# Patient Record
Sex: Male | Born: 1955 | Race: White | Hispanic: Yes | Marital: Married | Smoking: Never smoker
Health system: Southern US, Community
[De-identification: ages and names within clinical notes are randomized; demographics above are authoritative.]

---

## 2014-12-02 ENCOUNTER — Other Ambulatory Visit (HOSPITAL_COMMUNITY): Payer: Self-pay | Admitting: Family Medicine

## 2014-12-02 DIAGNOSIS — M6289 Other specified disorders of muscle: Secondary | ICD-10-CM

## 2014-12-02 DIAGNOSIS — R5383 Other fatigue: Secondary | ICD-10-CM

## 2014-12-02 DIAGNOSIS — R634 Abnormal weight loss: Secondary | ICD-10-CM

## 2014-12-31 ENCOUNTER — Encounter (HOSPITAL_COMMUNITY)
Admission: RE | Admit: 2014-12-31 | Discharge: 2014-12-31 | Disposition: A | Discharge status: Home / Self Care | Payer: Commercial Managed Care - PPO | Source: Ambulatory Visit | Attending: Family Medicine | Admitting: Family Medicine

## 2014-12-31 DIAGNOSIS — R5383 Other fatigue: Secondary | ICD-10-CM | POA: Insufficient documentation

## 2014-12-31 DIAGNOSIS — R634 Abnormal weight loss: Secondary | ICD-10-CM

## 2014-12-31 DIAGNOSIS — M6289 Other specified disorders of muscle: Secondary | ICD-10-CM | POA: Insufficient documentation

## 2014-12-31 MED ORDER — SODIUM IODIDE I 131 CAPSULE
11.0100 | Freq: Once | INTRAVENOUS | Status: AC | PRN
  Administered 2014-12-31: 11.01 via ORAL

## 2015-01-01 ENCOUNTER — Encounter (HOSPITAL_COMMUNITY)
Admission: RE | Admit: 2015-01-01 | Discharge: 2015-01-01 | Disposition: A | Discharge status: Home / Self Care | Payer: Commercial Managed Care - PPO | Source: Ambulatory Visit | Attending: Family Medicine | Admitting: Family Medicine

## 2015-01-01 DIAGNOSIS — R634 Abnormal weight loss: Secondary | ICD-10-CM | POA: Diagnosis present

## 2015-01-01 DIAGNOSIS — M6289 Other specified disorders of muscle: Secondary | ICD-10-CM | POA: Diagnosis present

## 2015-01-01 DIAGNOSIS — R5383 Other fatigue: Secondary | ICD-10-CM | POA: Diagnosis present

## 2015-01-05 MED ORDER — SODIUM PERTECHNETATE TC 99M INJECTION
10.0000 | Freq: Once | INTRAVENOUS | Status: AC | PRN
  Administered 2015-01-01: 10 via INTRAVENOUS

## 2016-06-16 ENCOUNTER — Encounter: Payer: Self-pay | Admitting: "Endocrinology

## 2016-06-16 ENCOUNTER — Ambulatory Visit: Payer: Self-pay | Admitting: "Endocrinology

## 2017-08-22 DIAGNOSIS — Z0000 Encounter for general adult medical examination without abnormal findings: Secondary | ICD-10-CM | POA: Diagnosis not present

## 2017-11-09 DIAGNOSIS — E0500 Thyrotoxicosis with diffuse goiter without thyrotoxic crisis or storm: Secondary | ICD-10-CM | POA: Diagnosis not present

## 2017-11-09 DIAGNOSIS — E0590 Thyrotoxicosis, unspecified without thyrotoxic crisis or storm: Secondary | ICD-10-CM | POA: Diagnosis not present

## 2017-11-09 DIAGNOSIS — L309 Dermatitis, unspecified: Secondary | ICD-10-CM | POA: Diagnosis not present

## 2017-11-09 DIAGNOSIS — R7611 Nonspecific reaction to tuberculin skin test without active tuberculosis: Secondary | ICD-10-CM | POA: Diagnosis not present

## 2017-11-09 DIAGNOSIS — Z1159 Encounter for screening for other viral diseases: Secondary | ICD-10-CM | POA: Diagnosis not present

## 2017-11-09 DIAGNOSIS — Z1389 Encounter for screening for other disorder: Secondary | ICD-10-CM | POA: Diagnosis not present

## 2017-12-21 ENCOUNTER — Encounter: Payer: Self-pay | Admitting: Endocrinology

## 2018-03-28 ENCOUNTER — Ambulatory Visit: Payer: Commercial Managed Care - PPO | Admitting: Internal Medicine

## 2018-03-29 ENCOUNTER — Ambulatory Visit: Payer: Commercial Managed Care - PPO | Admitting: Internal Medicine

## 2018-04-05 ENCOUNTER — Ambulatory Visit: Payer: Commercial Managed Care - PPO | Admitting: Internal Medicine

## 2018-04-19 ENCOUNTER — Encounter: Payer: Self-pay | Admitting: Internal Medicine

## 2018-04-19 ENCOUNTER — Ambulatory Visit: Payer: Commercial Managed Care - PPO | Admitting: Internal Medicine

## 2018-04-19 VITALS — BP 126/70 | HR 80 | Ht 66.14 in | Wt 217.0 lb

## 2018-04-19 DIAGNOSIS — E0500 Thyrotoxicosis with diffuse goiter without thyrotoxic crisis or storm: Secondary | ICD-10-CM | POA: Diagnosis not present

## 2018-04-19 LAB — TSH: TSH: 0.04 u[IU]/mL — ABNORMAL LOW (ref 0.35–4.50)

## 2018-04-19 LAB — T3, FREE: T3 FREE: 4 pg/mL (ref 2.3–4.2)

## 2018-04-19 LAB — T4, FREE: FREE T4: 1.43 ng/dL (ref 0.60–1.60)

## 2018-04-19 NOTE — Patient Instructions (Signed)
Please stop at the lab.  Please come back for a follow-up appointment in 3 months.   Hipertiroidismo Hyperthyroidism  El hipertiroidismo ocurre cuando la glndula tiroidea est demasiado activa (hiperactiva). La glndula tiroidea es una pequea glndula ubicada en la parte delantera inferior del cuello, justo delante de la trquea. Esta glndula produce hormonas que ayudan a Scientist, physiological forma en la que el cuerpo [place] los alimentos para obtener energa (metabolismo) as como tambin la funcin cardaca y la funcin cerebral. Estas hormonas tambin juegan un papel para [employer] huesos fuertes. Cuando la tiroides est hiperactiva, produce una cantidad excesiva de una hormona denominada tiroxina. Cules son las causas? Esta afeccin puede ser causada por lo siguiente:  Enfermedad de Luiz Blare. Este es un trastorno en el que el sistema del cuerpo encargado de combatir las enfermedades (sistema inmunitario) ataca la glndula tiroidea. Esta es la causa ms frecuente.  Inflamacin de la glndula tiroidea.  Un tumor en la glndula tiroidea.  Uso de ciertos medicamentos, como los siguientes: ? Reemplazo de hormona tiroidea recetado. ? Suplementos a base de hierbas que imitan a las hormonas tiroideas. ? Terapia con amiodarona.  Bultos slidos o llenos de lquido en la tiroides (ndulos tiroideos).  Ingerir una gran cantidad de yodo de alimentos o medicamentos. Qu incrementa el riesgo? Es ms probable que usted sufra esta afeccin si:  Es mujer.  Tiene antecedentes familiares de afecciones tiroideas.  Fuma tabaco.  [place] un medicamento denominado litio.  Toma medicamentos que afectan el sistema inmunitario (inmunosupresores). Cules son los signos o los sntomas? Los sntomas de esta afeccin incluyen los siguientes:  Nerviosismo.  Incapacidad para Patent examiner.  Prdida de peso sin causa aparente.  Diarrea.  Cambios en la textura del pelo o la piel.  Falta de latidos  cardacos o ms latidos cardacos.  Frecuencia cardaca acelerada.  Ausencia de la [employer].  Temblores en las manos.  Fatiga.  Agitacin.  Problemas para dormir.  Agrandamiento de la glndula tiroidea o un bulto en la tiroides (ndulo). Es posible que tambin tenga sntomas de la enfermedad de [place], los cuales pueden incluir:  Ojos saltones.  Ojos secos.  Ojos rojos o hinchados.  Problemas visuales. Cmo se diagnostica? Esta afeccin se puede diagnosticar en funcin de lo siguiente:  Sus sntomas y antecedentes mdicos.  Un examen fsico.  Anlisis de sangre.  Ecografa de la tiroides. En [employer], se utilizan ondas sonoras para generar imgenes de la glndula tiroidea.  Gammagrafa tiroidea. Se inyecta una sustancia radiactiva en una vena y las imgenes muestran la cantidad de yodo presente en la tiroides.  Prueba de captacin de yodo radiactivo (radioactive iodine uptake test, RAIU). Una pequea cantidad de yodo radiactivo se administra por boca para determinar la cantidad de yodo que absorbe la tiroides despus de un determinado perodo de [place]. Cmo se trata? El tratamiento depende de la causa y la gravedad de la afeccin. El tratamiento puede incluir lo siguiente:  Medicamentos para reducir la cantidad de hormona tiroidea que produce su cuerpo.  Tratamiento con yodo radiactivo (terapia con yodo radiactivo). Consiste en tragar Neomia Dear pequea dosis de yodo radiactivo, en cpsula o lquido, a fin de destruir las clulas tiroideas.  Ciruga para extirpar toda o parte de la glndula tiroidea. Es posible que necesite tomar medicamentos de reemplazo de hormona tiroidea por el resto de su vida despus de Neomia Dear ciruga de la tiroides.  Medicamentos para ayudar a Merchant navy officer. Siga estas indicaciones en su casa:   [employer]  medicamentos de venta libre y los recetados solamente como se lo haya indicado el mdico.  No consuma ningn producto que contenga  nicotina o tabaco, como cigarrillos y Administrator, Civil Servicecigarrillos electrnicos. Si necesita ayuda para dejar de fumar, consulte al American Expressmdico.  Siga las indicaciones del mdico con respecto a la dieta. Es posible que le indiquen limitar los alimentos que contengan yodo.  Concurra a todas las visitas de control como se lo haya indicado el mdico. Esto es importante. ? Tendr que hacerse anlisis de sangre peridicamente, de modo que el mdico pueda Passenger transport managercontrolar la afeccin. Comunquese con un mdico si:  Los sntomas no mejoran con Scientist, research (medical)el tratamiento.  Tiene fiebre.  Est tomando medicamentos de reemplazo de la hormona tiroidea y: ? Tiene sntomas de depresin. ? Se siente constantemente cansado. ? Aumenta de Munsonpeso. Solicite ayuda de inmediato si:  Midwifeiente dolor en el pecho.  Disminuy su estado de alerta o hay cambios en la conciencia.  Siente dolor abdominal.  Siente mareos.  Tiene latidos cardacos acelerados.  Tiene latidos cardacos irregulares.  Tiene dificultad para respirar. Resumen  La glndula tiroidea es una pequea glndula ubicada en la parte delantera inferior del cuello, justo delante de la trquea.  El hipertiroidismo ocurre cuando la glndula tiroidea est demasiado activa (hiperactiva) y produce una cantidad excesiva de una hormona denominada tiroxina.  La causa ms frecuente es la enfermedad de [place], un trastorno por el cual el sistema inmunitario ataca la glndula tiroidea.  El hipertiroidismo puede causar diferentes sntomas, por ejemplo, prdida de peso inexplicable, nerviosismo, incapacidad de Patent examinertolerar el calor o cambios en los latidos cardacos.  El tratamiento puede incluir medicamentos para reducir la cantidad de hormona tiroidea que produce su cuerpo, terapia con yodo radiactivo, ciruga o medicamentos para controlar los sntomas. Esta informacin no tiene Theme park managercomo fin reemplazar el consejo del mdico. Asegrese de hacerle al mdico cualquier pregunta que tenga. Document  Released: 02/27/2005 Document Revised: 04/19/2017 Document Reviewed: 04/19/2017 Elsevier Interactive Patient Education  2019 Elsevier Inc.   Terapia con yodo radioactivo (I-131) para el hipertiroidismo (Radioiodine [I-131] Therapy for Hyperthyroidism) La terapia con yodo radioactivo (I-131) es un procedimiento para tratar la hiperactividad de la glndula tiroidea (hipertiroidismo). La tiroides es una glndula del cuello que, a travs del yodo, [place]ayuda a Scientist, physiologicalcontrolar la forma en la que el cuerpo [place] los alimentos (metabolismo). En este procedimiento, se traga un comprimido o un lquido que contiene I-131. El I-131 es yodo fabricado (sinttico) que emite radiacin. Esto destruye las clulas tiroideas y revierte el hipertiroidismo. INFORME A SU MDICO:  Cualquier alergia que tenga.  Todos los Walt Disneymedicamentos que utiliza, incluidos vitaminas, hierbas, gotas oftlmicas, cremas y [address]medicamentos de [address] libre.  Problemas previos que usted o los Graybar Electricmiembros de su familia hayan tenido con el uso de anestsicos.  Enfermedades de la sangre que tenga.  Si tiene cirugas previas.  Cualquier enfermedad que tenga.  Si est embarazada o podra estarlo, o si ha pasado por la Hicksfurtmenopausia, en el caso de que corresponda.  Si tiene hijos en la actualidad.  Si planifica tener hijos en los prximos 2aos.  Cualquier contacto que tenga con nios o con mujeres embarazadas.  Sus planes de viajar durante los prximos 3meses.  Si debe pasar por detectores de radiacin en el trabajo o cuando viaja. RIESGOS Y COMPLICACIONES En general, se trata de un procedimiento seguro. Sin embargo, pueden ocurrir complicaciones, por ejemplo:  [place]ao en otras estructuras u rganos, como las glndulas salivales. Esto podra ocasionar sequedad en la boca y prdida  del sentido del gusto.  Recuento espermtico bajo, en el caso de que corresponda. Esto puede ocasionar infertilidad temporal.  Dolor de cuello o garganta. Esto es  temporal.  Leve aumento del riesgo de cncer de tiroides.  Nuseas o vmitos. ANTES DEL PROCEDIMIENTO  Consulte a su mdico si debe cambiar o suspender los medicamentos que toma habitualmente. Esto es muy importante si toma medicamentos para la diabetes, anticoagulantes o medicamentos para la tiroides.  Si es [place], es posible que tenga que hacerse una prueba de Woolseyembarazo.  Las mujeres que amamantan deben planificar dejar de Media plannerhacerlo al menos 6semanas antes del procedimiento.  Siga las indicaciones del mdico respecto de las restricciones para las comidas o las bebidas.  Planifique evitar el contacto con otras personas durante 1semana despus del tratamiento. Lo ms importante es Recruitment consultantevitar el contacto con los nios y las Crooksvilleembarazadas. Para esto, planifique faltar al Aleen Campitrabajo y Mullensquedarse en su casa, organice el cuidado de los nios y Zambiaduerma solo o sola, si estas cosas corresponden.  Planifique conducir hasta su casa despus del tratamiento. No viaje en transporte pblico. Si necesita que alguien lo lleve a su casa, sintese lo ms lejos posible del conductor. PROCEDIMIENTO  Le darn una dosis de I-131 para que trague. Ser en forma de comprimido o lquido.  La glndula tiroidea absorber el I-131 durante los 3meses siguientes. El Oxfordproceso del tratamiento estar completo en alrededor de 6meses. DESPUS DEL PROCEDIMIENTO  Es posible que Agricultural consultantdeba permanecer en el hospital durante 24horas despus del tratamiento. Esto depende de los requisitos de [address] estado.  Siga las indicaciones del mdico acerca de lo siguiente: ? Cmo cuidarse despus del procedimiento. ? Cmo proteger a los dems de la exposicin a la radiacin a medida que el cuerpo la elimine. Esta informacin no tiene Theme park managercomo fin reemplazar el consejo del mdico. Asegrese de hacerle al mdico cualquier pregunta que tenga. Document Released: 12/18/2012 Document Revised: 06/21/2015 Document Reviewed: 06/24/2014 Elsevier Interactive Patient  Education  2019 Arvin[employer]itorElsevier Inc.

## 2018-04-19 NOTE — Progress Notes (Signed)
Patient ID: Tyler Gutierrez, male   DOB: June 13, 1955, 63 y.o.   MRN: [id number]    HPI  Tyler Gutierrez is a 63 y.o.-year-old male, referred by his PCP, Dr. Purnell Shoemaker, for management of Graves disease.  He was dx'ed with hyperthyroidism in 2016 by screening at work. Retrospectively, at that time, he lost 20 lbs in 3-4 mo; increased heat intolerance and sweating.  He saw hs PCP >> started MMI >> lately 20 mg 3x a day x last year.  Compliant with the medication, however, TFTs remained uncontrolled.  PCP decided to refer him to endocrinology at that time.  He ran out MMI in the first week of 03/2018.  He is now off the medication for the last month.  He feels that his heat intolerance has improved after he stopped methimazole.  I reviewed pt's thyroid tests: 11/10/2017: TSH 0.008, free T4 1.96 (0.82-1.77): Free T3 4.33 (2.0-4.4) No results found for: TSH, FREET4, T3FREE  Antithyroid antibodies: No results found for: TSI  Pt denies: - feeling nodules in neck - dysphagia - choking - SOB with lying down But he has hoarseness.   He denies: - no fatigue - + excessive sweating/heat intolerance - no tremors - no anxiety - no palpitations - no hyperdefecation - + weight loss, + weight gain - no hair loss - + increased energy  Pt does not have a FH of thyroid ds. No FH of thyroid cancer. No h/o radiation tx to head or neck.  No recent contrast studies. No steroid use. No herbal supplements. No Biotin use.  Pt. also has no other medical history.  ROS: Constitutional: + see HPI Eyes: no blurry vision, no xerophthalmia ENT: no sore throat, + see HPI Cardiovascular: no CP/SOB/palpitations/leg swelling Respiratory: no cough/SOB Gastrointestinal: no N/V/D/C Musculoskeletal: no muscle/joint aches Skin: no rashes Neurological: no tremors/numbness/tingling/dizziness Psychiatric: no depression/anxiety  PMH - see HPI  Social History   Socioeconomic History  .  Marital status: Married    Spouse name: Not on file  . Number of children: Not on file  . Years of education: Not on file  . Highest education level: Not on file  Occupational History  . Not on file  Social Needs  . Financial resource strain: Not on file  . Food insecurity:    Worry: Not on file    Inability: Not on file  . Transportation needs:    Medical: Not on file    Non-medical: Not on file  Tobacco Use  . Smoking status: Never Smoker  . Smokeless tobacco: Never Used  Substance and Sexual Activity  . Alcohol use: Not on file  . Drug use: Not on file  . Sexual activity: Not on file  Lifestyle  . Physical activity:    Days per week: Not on file    Minutes per session: Not on file  . Stress: Not on file  Relationships  . Social connections:    Talks on phone: Not on file    Gets together: Not on file    Attends religious service: Not on file    Active member of club or organization: Not on file    Attends meetings of clubs or organizations: Not on file    Relationship status: Not on file  . Intimate partner violence:    Fear of current or ex partner: Not on file    Emotionally abused: Not on file    Physically abused: Not on file    Forced sexual activity: Not  on file  Other Topics Concern  . Not on file  Social History Narrative  . Not on file   No current outpatient medications on file prior to visit.   No current facility-administered medications on file prior to visit.    No Known Allergies   No family history on file.  PE: BP 126/70   Pulse 80   Ht 5' 6.14" (1.68 m)   Wt 217 lb (98.4 kg)   SpO2 96%   BMI 34.87 kg/m  Wt Readings from Last 3 Encounters:  04/19/18 217 lb (98.4 kg)   Constitutional: overweight, in NAD Eyes: PERRLA, EOMI, no exophthalmos, no lid lag, no stare ENT: moist mucous membranes, no thyromegaly, no thyroid bruits, no cervical lymphadenopathy Cardiovascular: RRR, No MRG Respiratory: CTA B Gastrointestinal: abdomen soft,  NT, ND, BS+ Musculoskeletal: no deformities, strength intact in all 4 Skin: moist, warm, no rashes Neurological: no tremor with outstretched hands, DTR normal in all 4  ASSESSMENT: 1. Graves ds. - Per records from PCP, however, I do not have the actual initial investigation for this - uncontrolled  PLAN:  1. Patient with a history of Graves' disease with uncontrolled TFTs and initial thyrotoxic sxs: weight loss, heat intolerance.  His weight loss alternated with weight gain in the last 3 years but heat intolerance was stable and it only improved after he came off methimazole.  He was initially on the lower dose then increased to 20 mg 3 times a day.  He thinks he has been on this dose for approximately a year.  He had labs frequently by PCP and this continues to be abnormal.  He was referred to endocrinology to seek an alternative management.  However, between the time he saw his PCP and his appointment today he ran out of his methimazole 1 month ago.  He tells me that he felt better off methimazole, actually. - he does not appear to have exogenous causes for the low TSH.  - will check the TSH, fT3 and fT4 and also add thyroid stimulating antibodies check for Graves' disease activity.  - we may need an uptake and scan to differentiate between the 3 above possible etiologies, and also would need to obtain records from PCP about previous Graves investigation. - we discussed about possible modalities of treatment for the above conditions, to include retrying methimazole use, radioactive iodine ablation or (last resort) surgery.  However, he has been on a high dose of methimazole and not responding well to it.  Since he states compliance, we discussed that the next step would be RAI treatment.  We may need to start methimazole to lower his TFTs before the RAI treatment.  We did discuss that after the treatment, he will most likely need to start thyroid hormones.  I explained that this is 1 tablet that he  will need to take most likely for the rest of his life.  He agrees with this plan. - I do not feel that we need to add beta blockers at this time, since he is not tachycardic or tremulous -No signs of Graves' ophthalmopathy; he does not have any double vision, blurry vision, eye pain, chemosis. - RTC in 3 months, but likely sooner for repeat labs  Office Visit on 04/19/2018  Component Date Value Ref Range Status  . TSH 04/19/2018 0.04* 0.35 - 4.50 uIU/mL Final  . Free T4 04/19/2018 1.43  0.60 - 1.60 ng/dL Final   Comment: Specimens from patients who are undergoing biotin therapy and /  or ingesting biotin supplements may contain high levels of biotin.  The higher biotin concentration in these specimens interferes with this Free T4 assay.  Specimens that contain high levels  of biotin may cause false high results for this Free T4 assay.  Please interpret results in light of the total clinical presentation of the patient.    . T3, Free 04/19/2018 4.0  2.3 - 4.2 pg/mL Final  . TSI 04/19/2018 123  <140 % baseline Final   Comment: . Thyroid stimulating immunoglobulins (TSI) can engage the TSH receptors resulting in hyperthyroidism in Graves' disease patients. TSI levels can be useful in monitoring the clinical outcome of Graves' disease as well as assessing the potential for hyperthyroidism from maternal-fetal transfer. TSI results greater than or equal to (>=) 140% of the Reference Control are considered positive. Marland Kitchen. NOTE: A serum TSH level greater than 350 micro-International Units/mL can interfere with the TSI bioassay and potentially give false positive results. . Patients who are pregnant and are suspected of having hyperthyroidism should have both TSI and human Chorionic Gonadotropin(hCG) tests measured. A serum hCG level greater than 40,625 mIU/mL can interfere with the TSI bioassay and may give false negative results. In these patients it is recommended that a second TSI be  obtained when the hCG concentration falls below 40,625 mIU/mL (usually after approximately 20-weeks gestation)                          . . The analytical performance characteristics of this assay have been determined by [facility], [place], [place].  The modifications have not been cleared or approved by the FDA.  This assay has been validated pursuant to the CLIA regulations and is used for clinical purposes. Marland Kitchen.    TSH suppressed, but improved.  The rest of the labs are normal, including the Graves' antibodies.  It appears that his Graves' disease is improving.  In this context and especially since he is feeling better after he stopped methimazole, I will advise him to continue off methimazole and I would like to repeat his tests in 1 month.  At that time, we may need to start a low-dose methimazole if the tests worsen.  Carlus Pavlovristina Fernandez Kenley, MD PhD [facility]

## 2018-04-23 LAB — THYROID STIMULATING IMMUNOGLOBULIN: TSI: 123 % baseline (ref <140)

## 2018-04-24 ENCOUNTER — Encounter: Payer: Self-pay | Admitting: Internal Medicine

## 2018-07-22 ENCOUNTER — Encounter: Payer: Self-pay | Admitting: Internal Medicine

## 2018-07-22 ENCOUNTER — Ambulatory Visit (INDEPENDENT_AMBULATORY_CARE_PROVIDER_SITE_OTHER): Payer: Commercial Managed Care - PPO | Admitting: Internal Medicine

## 2018-07-22 ENCOUNTER — Other Ambulatory Visit: Payer: Self-pay

## 2018-07-22 VITALS — BP 110/72 | HR 75 | Temp 97.0°F | Ht 66.0 in | Wt 214.0 lb

## 2018-07-22 DIAGNOSIS — E0500 Thyrotoxicosis with diffuse goiter without thyrotoxic crisis or storm: Secondary | ICD-10-CM

## 2018-07-22 NOTE — Patient Instructions (Signed)
Please continue off Methimazole.  Please stop at the lab.  Please come back for a follow-up appointment in 4 months.  

## 2018-07-22 NOTE — Progress Notes (Signed)
Patient ID: Tyler Gutierrez, male   DOB: 03/12/1956, 63 y.o.   MRN: [id number]    HPI  Tyler Gutierrez is a 63 y.o.-year-old male, initially referred by his PCP, Dr. Purnell Shoemaker, now returning for follow-up for Graves disease.  Last visit 3 months ago.  Reviewed and addended history: He was dx'ed with hyperthyroidism in 2016 by screening at work. Retrospectively, at that time, he lost 20 lbs in 3-4 mo; increased heat intolerance and sweating.  He saw hs PCP >> started MMI >> lately 20 mg 3x a day x last year.  However, he was not very compliant with his medication.  TFTs remained uncontrolled.  PCP decided to refer him to endocrinology at that time.  He ran out MMI in the first week of 03/2018.  At last visit, he was off the medication for approximately 1 month.  He felt that his heat intolerance improved after he stopped methimazole.   At that time, we checked his TFTs and they were much improved, with now normal free thyroid hormones and improved TSH.  His TSI antibodies were not elevated.  Therefore, I advised him to stay off methimazole pending repeat TFTs in 1.5 months.  However, he did not return for labs.  At this visit, he continues to feels well, without complaints.  I reviewed his TFTs: Lab Results  Component Value Date   TSH 0.04 (L) 04/19/2018   FREET4 1.43 04/19/2018   T3FREE 4.0 04/19/2018  11/10/2017: TSH 0.008, free T4 1.96 (0.82-1.77): Free T3 4.33 (2.0-4.4)  His Graves' antibodies were not elevated: Lab Results  Component Value Date   TSI 123 04/19/2018   Pt denies: - feeling nodules in neck - dysphagia - choking - SOB with lying down He has hoarseness.  Pt does not have a FH of thyroid ds. No FH of thyroid cancer. No h/o radiation tx to head or neck.  No seaweed or kelp. No recent contrast studies. No herbal supplements. No Biotin use. No recent steroids use.   Pt. also has no other medical history.  ROS: Constitutional: no weight gain/no  weight loss, no fatigue, no subjective hyperthermia, no subjective hypothermia Eyes: no blurry vision, no xerophthalmia ENT: no sore throat, + see HPI Cardiovascular: no CP/no SOB/no palpitations/no leg swelling Respiratory: no cough/no SOB/no wheezing Gastrointestinal: no N/no V/no D/no C/no acid reflux Musculoskeletal: no muscle aches/no joint aches Skin: no rashes, no hair loss Neurological: no tremors/no numbness/no tingling/no dizziness  I reviewed pt's medications, allergies, PMH, social hx, family hx, and changes were documented in the history of present illness. Otherwise, unchanged from my initial visit note. PMH - see HPI  Social History   Socioeconomic History  . Marital status: Married    Spouse name: Not on file  . Number of children: Not on file  . Years of education: Not on file  . Highest education level: Not on file  Occupational History  . Not on file  Social Needs  . Financial resource strain: Not on file  . Food insecurity:    Worry: Not on file    Inability: Not on file  . Transportation needs:    Medical: Not on file    Non-medical: Not on file  Tobacco Use  . Smoking status: Never Smoker  . Smokeless tobacco: Never Used  Substance and Sexual Activity  . Alcohol use: Not on file  . Drug use: Not on file  . Sexual activity: Not on file  Lifestyle  . Physical activity:  Days per week: Not on file    Minutes per session: Not on file  . Stress: Not on file  Relationships  . Social connections:    Talks on phone: Not on file    Gets together: Not on file    Attends religious service: Not on file    Active member of club or organization: Not on file    Attends meetings of clubs or organizations: Not on file    Relationship status: Not on file  . Intimate partner violence:    Fear of current or ex partner: Not on file    Emotionally abused: Not on file    Physically abused: Not on file    Forced sexual activity: Not on file  Other Topics  Concern  . Not on file  Social History Narrative  . Not on file   No current outpatient medications on file prior to visit.   No current facility-administered medications on file prior to visit.    No Known Allergies   No family history on file.  PE: BP 110/72   Pulse 75   Temp (!) 97 F (36.1 C)   Ht 5\' 6"  (1.676 m)   Wt 214 lb (97.1 kg)   SpO2 99%   BMI 34.54 kg/m  Wt Readings from Last 3 Encounters:  07/22/18 214 lb (97.1 kg)  04/19/18 217 lb (98.4 kg)   Constitutional: overweight, in NAD Eyes: PERRLA, EOMI, no exophthalmos, no lid lag, no stare ENT: moist mucous membranes, no thyromegaly, no cervical lymphadenopathy Cardiovascular: RRR, No MRG Respiratory: CTA B Gastrointestinal: abdomen soft, NT, ND, BS+ Musculoskeletal: no deformities, strength intact in all 4 Skin: moist, warm, no rashes Neurological: no tremor with outstretched hands, DTR normal in all 4  ASSESSMENT: 1. Graves ds. - Per records from PCP, however, I do not have the actual initial investigation for this - uncontrolled  PLAN:  1. Patient with a history of Graves' disease with uncontrolled TFTs and initial thyrotoxic symptoms: Weight loss, heat intolerance.  His weight loss alternating with weight gain in the last 3 years, but heat intolerance was stable.  He was started on methimazole and was on this for a year with doses  up to 20 mg 3 times a day, but he stopped the medication 1 month prior to our last appointment after he ran out and noticed that his heat intolerance improved since being off methimazole.  He told me that he felt better off methimazole. -At last visit, we checked his TFTs and they have improved.  His TSH was better, although still low, while his free thyroid hormones were normal.  His TSI antibodies were not elevated.  Since he was feeling better and tests were improving, we did not start methimazole then.  I advised him to come back for labs in 1.5 months, but he did not return as  she did not understand that we needed to repeat his labs so soon -We will check his TFTs at this visit and decide about methimazole when the tests return.  As of now, he does not appear to need RAI treatment or thyroidectomy. -He does not have signs of Graves' ophthalmopathy: No blurry vision, no double vision, no ecchymosis, no eye pain. -I will see him back in 4 months, but likely sooner for labs.  - time spent with the patient: 15 min, of which >50% was spent in obtaining information about his symptoms, reviewing his previous labs, evaluations, and treatments, counseling him about his condition (  please see the discussed topics above), and developing a plan to further investigate and treat it; he had a number of questions which I addressed.  Office Visit on 07/22/2018  Component Date Value Ref Range Status  . TSH 07/22/2018 <0.01* 0.35 - 4.50 uIU/mL Final  . Free T4 07/22/2018 1.66* 0.60 - 1.60 ng/dL Final   Comment: Specimens from patients who are undergoing biotin therapy and /or ingesting biotin supplements may contain high levels of biotin.  The higher biotin concentration in these specimens interferes with this Free T4 assay.  Specimens that contain high levels  of biotin may cause false high results for this Free T4 assay.  Please interpret results in light of the total clinical presentation of the patient.    . T3, Free 07/22/2018 4.1  2.3 - 4.2 pg/mL Final   Unfortunately, labs are a little worse.  We will try to start 5 mg of methimazole and have him back for labs in 1.5 months.  Carlus Pavlovristina Devian Bartolomei, MD PhD [facility]

## 2018-07-23 LAB — T4, FREE: Free T4: 1.66 ng/dL — ABNORMAL HIGH (ref 0.60–1.60)

## 2018-07-23 LAB — TSH: TSH: <0.01 u[IU]/mL — ABNORMAL LOW (ref 0.35–4.50)

## 2018-07-23 LAB — T3, FREE: T3, Free: 4.1 pg/mL (ref 2.3–4.2)

## 2018-07-24 MED ORDER — METHIMAZOLE 5 MG PO TABS: 5.0000 mg | ORAL_TABLET | Freq: Every day | ORAL | 3 refills | Status: DC

## 2018-07-25 ENCOUNTER — Telehealth: Payer: Self-pay

## 2018-07-25 NOTE — Telephone Encounter (Signed)
-----   Message from Carlus Pavlov, MD sent at 07/24/2018  1:35 PM EDT ----- Efraim Kaufmann, can you please call pt through the Spanish interpreter line:Unfortunately, labs are a little worse.  We will try to start 5 mg of methimazole and have him back for labs in 1.5 months.  I added this to his medication list but it is on "no print".  If he agrees to try this, please send it to the pharmacy.  Labs are in.

## 2018-07-26 NOTE — Telephone Encounter (Signed)
LM via Spanish interpreter.

## 2018-09-06 ENCOUNTER — Other Ambulatory Visit: Payer: Self-pay

## 2018-09-06 ENCOUNTER — Other Ambulatory Visit (INDEPENDENT_AMBULATORY_CARE_PROVIDER_SITE_OTHER): Payer: Commercial Managed Care - PPO

## 2018-09-06 DIAGNOSIS — E0500 Thyrotoxicosis with diffuse goiter without thyrotoxic crisis or storm: Secondary | ICD-10-CM | POA: Diagnosis not present

## 2018-09-06 LAB — T3, FREE: T3, Free: 3.9 pg/mL (ref 2.3–4.2)

## 2018-09-06 LAB — TSH: TSH: <0.01 u[IU]/mL — ABNORMAL LOW (ref 0.35–4.50)

## 2018-09-06 LAB — T4, FREE: Free T4: 1.59 ng/dL (ref 0.60–1.60)

## 2018-11-08 ENCOUNTER — Other Ambulatory Visit: Payer: Self-pay

## 2018-11-08 ENCOUNTER — Other Ambulatory Visit (INDEPENDENT_AMBULATORY_CARE_PROVIDER_SITE_OTHER): Payer: Commercial Managed Care - PPO

## 2018-11-08 DIAGNOSIS — E0500 Thyrotoxicosis with diffuse goiter without thyrotoxic crisis or storm: Secondary | ICD-10-CM | POA: Diagnosis not present

## 2018-11-08 LAB — T3, FREE: T3, Free: 3.8 pg/mL (ref 2.3–4.2)

## 2018-11-08 LAB — T4, FREE: Free T4: 1.44 ng/dL (ref 0.60–1.60)

## 2018-11-08 LAB — TSH: TSH: <0.01 u[IU]/mL — ABNORMAL LOW (ref 0.35–4.50)

## 2018-11-22 ENCOUNTER — Ambulatory Visit (INDEPENDENT_AMBULATORY_CARE_PROVIDER_SITE_OTHER): Payer: Commercial Managed Care - PPO | Admitting: Internal Medicine

## 2018-11-22 ENCOUNTER — Encounter: Payer: Self-pay | Admitting: Internal Medicine

## 2018-11-22 ENCOUNTER — Other Ambulatory Visit: Payer: Self-pay

## 2018-11-22 VITALS — BP 120/82 | HR 66 | Ht 66.0 in | Wt 218.0 lb

## 2018-11-22 DIAGNOSIS — E0500 Thyrotoxicosis with diffuse goiter without thyrotoxic crisis or storm: Secondary | ICD-10-CM

## 2018-11-22 NOTE — Patient Instructions (Signed)
Please continue off Methimazole.  Please stop at the lab.  Please come back for a follow-up appointment in 4 months.  

## 2018-11-22 NOTE — Addendum Note (Signed)
Addended by: Kaylyn Lim I on: 11/22/2018 05:01 PM   Modules accepted: Orders

## 2018-11-22 NOTE — Addendum Note (Signed)
Addended by: STONE-ELMORE, Olumide Dolinger I on: 11/22/2018 05:01 PM   Modules accepted: Orders  

## 2018-11-22 NOTE — Progress Notes (Addendum)
Patient ID: Tyler Gutierrez, male   DOB: 09/26/1955, 63 y.o.   MRN: [id number]    HPI  Tyler Gutierrez is a 63 y.o.-year-old male, initially referred by his PCP, Dr. Rene Paci, returning for follow-up for Graves disease.  Last visit 4 months ago.  Reviewed and addended history: He was dx'ed with hyperthyroidism in 2016 by screening at work. Retrospectively, at that time, he lost 20 lbs in 3-4 mo; increased heat intolerance and sweating.  12/31/2014: Thyroid uptake and scan: Consistent with Graves' disease: Markedly diffuse increased uptake throughout both lobes of the thyroid gland identified. No dominant hot or cold nodules. The 4 hour radioactive iodine uptake is equal to 89.5%. The 24 hour radioactive iodine uptake is equal to 99.7%.  He saw hs PCP >> started MMI >> lately 20 mg 3x a day x last year.  However, he was not very compliant with his medication.  TFTs remained uncontrolled.  PCP decided to refer him to endocrinology at that time.  He ran out [place] in the first week of 03/2018.  At last visit, he was off the medication for approximately 1 month.  He felt that his heat intolerance improved after he stopped methimazole.  At that time, we checked his TFTs and they were much improved, with now normal free thyroid hormones and improved TSH.  His TSI antibodies were not elevated.  Therefore, I advised him to stay off methimazole pending repeat TFTs in 1.5 months.  However, he did not return for labs.  In 07/2018, his TSH was still low and he is free T4 was elevated.  We started methimazole 5 mg daily in 07/2018 with improvement of his TFTs in 08/2018 but TSH remained undetectable in 10/2018 and I advised him to increase the dose to 7.5 mg daily in 10/2018.  However, at this visit, he tells me that he ran out of methimazole in 08/2018 at the pharmacy told him that he cannot refill this.  He did not let me know... He is now off methimazole.  He continues to feel well  without complaints.  I reviewed his TFTs: Lab Results  Component Value Date   TSH <0.01 (L) 11/08/2018   TSH <0.01 (L) 09/06/2018   TSH <0.01 (L) 07/22/2018   TSH 0.04 (L) 04/19/2018   FREET4 1.44 11/08/2018   FREET4 1.59 09/06/2018   FREET4 1.66 (H) 07/22/2018   FREET4 1.43 04/19/2018   T3FREE 3.8 11/08/2018   T3FREE 3.9 09/06/2018   T3FREE 4.1 07/22/2018   T3FREE 4.0 04/19/2018  11/10/2017: TSH 0.008, free T4 1.96 (0.82-1.77): Free T3 4.33 (2.0-4.4)  His Graves' antibodies were not elevated: Lab Results  Component Value Date   TSI 123 04/19/2018   Pt denies: - feeling nodules in neck - hoarseness - dysphagia - choking - SOB with lying down  Pt does not have a FH of thyroid ds. No FH of thyroid cancer. No h/o radiation tx to head or neck.  No seaweed or kelp. No recent contrast studies. No herbal supplements. No Biotin use. No recent steroids use.   Pt. also has no other medical history.  ROS: Constitutional: + weight gain/no weight loss, no fatigue, no subjective hyperthermia, no subjective hypothermia Eyes: no blurry vision, no xerophthalmia ENT: no sore throat, + see HPI Cardiovascular: no CP/no SOB/no palpitations/no leg swelling Respiratory: no cough/no SOB/no wheezing Gastrointestinal: no N/no V/no D/no C/no acid reflux Musculoskeletal: no muscle aches/no joint aches Skin: no rashes, no hair loss Neurological: no tremors/no numbness/no tingling/no  dizziness  I reviewed pt's medications, allergies, PMH, social hx, family hx, and changes were documented in the history of present illness. Otherwise, unchanged from my initial visit note. PMH - see HPI  Social History   Socioeconomic History  . Marital status: Married    Spouse name: Not on file  . Number of children: Not on file  . Years of education: Not on file  . Highest education level: Not on file  Occupational History  . Not on file  Social Needs  . Financial resource strain: Not on file  . Food  insecurity    Worry: Not on file    Inability: Not on file  . Transportation needs    Medical: Not on file    Non-medical: Not on file  Tobacco Use  . Smoking status: Never Smoker  . Smokeless tobacco: Never Used  Substance and Sexual Activity  . Alcohol use: Not on file  . Drug use: Not on file  . Sexual activity: Not on file  Lifestyle  . Physical activity    Days per week: Not on file    Minutes per session: Not on file  . Stress: Not on file  Relationships  . Social Musicianconnections    Talks on phone: Not on file    Gets together: Not on file    Attends religious service: Not on file    Active member of club or organization: Not on file    Attends meetings of clubs or organizations: Not on file    Relationship status: Not on file  . Intimate partner violence    Fear of current or ex partner: Not on file    Emotionally abused: Not on file    Physically abused: Not on file    Forced sexual activity: Not on file  Other Topics Concern  . Not on file  Social History Narrative  . Not on file   Current Outpatient Medications on File Prior to Visit  Medication Sig Dispense Refill  . methimazole (TAPAZOLE) 5 MG tablet Take 1 tablet (5 mg total) by mouth daily. (Patient not taking: Reported on 11/22/2018) 60 tablet 3   No current facility-administered medications on file prior to visit.    No Known Allergies   No family history on file.  PE: BP 120/82   Pulse 66   Ht 5\' 6"  (1.676 m)   Wt 218 lb (98.9 kg)   SpO2 97%   BMI 35.19 kg/m  Wt Readings from Last 3 Encounters:  11/22/18 218 lb (98.9 kg)  07/22/18 214 lb (97.1 kg)  04/19/18 217 lb (98.4 kg)   Constitutional: overweight, in NAD Eyes: PERRLA, EOMI, no exophthalmos ENT: moist mucous membranes, no thyromegaly, no cervical lymphadenopathy Cardiovascular: RRR, No MRG Respiratory: CTA B Gastrointestinal: abdomen soft, NT, ND, BS+ Musculoskeletal: no deformities, strength intact in all 4 Skin: moist, warm, no  rashes Neurological: no tremor with outstretched hands, DTR normal in all 4  ASSESSMENT: 1. Graves ds. - Per records from PCP, however, I do not have the actual initial investigation for this - uncontrolled  PLAN:  1. Patient with a 4-year history of Graves' disease with uncontrolled TFTs and initial thyrotoxic symptoms: Weight loss that he had intolerance, which resolved.  She was started on methimazole and was this for a year with doses up to 20 mg 3 times a day but he stopped the medication and the end of last year and felt better off it.  In 03/2018, TFTs were improved  and TSI antibodies were not elevated.  Since he was feeling better off the methimazole, we did not start it then, however, he did not return for labs afterwards.  When I saw him in 07/2018: TSH was low and free T4 was high.  We started 5 mg of methimazole daily.  She took this for a month but ran out of prescription and could not refill it due to an apparent problem with the pharmacy.  His TSH remained suppressed with normal free thyroid hormones at last check in 10/2018, at that time apparently after medication for 2 months. -At this visit, he remains asymptomatic -We will recheck his TFTs and decide about starting methimazole or not when the results are back -We also discussed about definitive treatment for his Graves' disease with RAI treatment or thyroidectomy.  I explained that if we do go ahead with these procedures, he will need levothyroxine for the rest of his body.  He is open to the idea of RAI treatment if needed.  If he continues to be noncompliant with the methimazole or not responding well to methimazole, by next visit, I would probably suggest to have another thyroid uptake and then RAI treatment. -No signs of Graves' ophthalmopathy: No blurry vision, no double vision no chemosis, no eye pain -I will see him back in 4 months, but likely sooner for labs.  I also strongly encouraged him to join my chart so we can better  stay in touch  - time spent with the patient: 15 min, of which >50% was spent in obtaining information about his symptoms, reviewing his previous labs, evaluations, and treatments, counseling him about his condition (please see the discussed topics above), and developing a plan to further investigate and treat it; he had a number of questions which I addressed.  Component     Latest Ref Rng & Units 11/22/2018  TSH     0.40 - 4.50 mIU/L 0.02 (L)  T4,Free(Direct)     0.8 - 1.8 ng/dL 1.7  Triiodothyronine,Free,Serum     2.3 - 4.2 pg/mL 4.1  TSH is better, now detectable.  Since he is not symptomatic and his TSH is improved while his free thyroid hormones are normal, I would advise him to stay off the methimazole for now and have him back for labs in 1.5 months.  Carlus Pavlov, MD PhD [facility]

## 2018-11-22 NOTE — Addendum Note (Signed)
Addended by: STONE-ELMORE, Amy Belloso I on: 11/22/2018 05:01 PM   Modules accepted: Orders  

## 2018-11-23 LAB — T4, FREE: Free T4: 1.7 ng/dL (ref 0.8–1.8)

## 2018-11-23 LAB — TSH: TSH: 0.02 mIU/L — ABNORMAL LOW (ref 0.40–4.50)

## 2018-11-23 LAB — T3, FREE: T3, Free: 4.1 pg/mL (ref 2.3–4.2)

## 2018-11-29 ENCOUNTER — Telehealth: Payer: Self-pay

## 2018-11-29 NOTE — Telephone Encounter (Signed)
-----   Message from Philemon Kingdom, MD sent at 11/25/2018 12:46 PM EDT ----- Lenna Sciara, can you please call pt: TSH is better, now detectable.  Since he is not symptomatic and his TSH is improved while his free thyroid hormones are normal, I would advise him to stay off the methimazole for now and have him back for labs in 1.5 months. Labs are in.

## 2019-03-26 ENCOUNTER — Other Ambulatory Visit: Payer: Self-pay

## 2019-03-28 ENCOUNTER — Ambulatory Visit (INDEPENDENT_AMBULATORY_CARE_PROVIDER_SITE_OTHER): Payer: Commercial Managed Care - PPO | Admitting: Internal Medicine

## 2019-03-28 ENCOUNTER — Encounter: Payer: Self-pay | Admitting: Internal Medicine

## 2019-03-28 VITALS — BP 120/78 | HR 90 | Ht 66.0 in | Wt 219.0 lb

## 2019-03-28 DIAGNOSIS — E0500 Thyrotoxicosis with diffuse goiter without thyrotoxic crisis or storm: Secondary | ICD-10-CM | POA: Diagnosis not present

## 2019-03-28 NOTE — Progress Notes (Signed)
Patient ID: Tyler Gutierrez, male   DOB: Jan 08, 1956, 64 y.o.   MRN: [id number]   This visit occurred during the SARS-CoV-2 public health emergency.  Safety protocols were in place, including screening questions prior to the visit, additional usage of staff PPE, and extensive cleaning of exam room while observing appropriate contact time as indicated for disinfecting solutions.   HPI  Tyler Gutierrez is a 64 y.o.-year-old male, initially referred by his PCP, Dr. Purnell Shoemaker, returning for follow-up for Graves disease.  Last visit 4 months ago.  Reviewed and addended history: He was dx'ed with hyperthyroidism in 2016 by screening at work. Retrospectively, at that time, he lost 20 lbs in 3-4 mo; increased heat intolerance and sweating.  12/31/2014: Thyroid uptake and scan: Consistent with Graves' disease: Markedly diffuse increased uptake throughout both lobes of the thyroid gland identified. No dominant hot or cold nodules. The 4 hour radioactive iodine uptake is equal to 89.5%. The 24 hour radioactive iodine uptake is equal to 99.7%.  He saw hs PCP >> started MMI >> lately 20 mg 3x a day x last year.  However, he was not very compliant with his medication.  TFTs remained uncontrolled.  PCP decided to refer him to endocrinology at that time.  He ran out MMI in the first week of 03/2018.  At last visit, he was off the medication for approximately 1 month.  He felt that his heat intolerance improved after he stopped methimazole.  At that time, we checked his TFTs and they were much improved, with now normal free thyroid hormones and improved TSH.  His TSI antibodies were not elevated.  Therefore, I advised him to stay off methimazole pending repeat TFTs in 1.5 months.  However, he did not return for labs.  In 07/2018, his TSH was still low and he is free T4 was elevated.  We started methimazole 5 mg daily in 07/2018 with improvement of his TFTs in 08/2018 but TSH remained undetectable  in 10/2018 and I advised him to increase the dose to 7.5 mg daily in 10/2018.  At last visit he told me that he ran out of methimazole in 08/2018 due to lack of refills.  He did not let me know.  However, in 11/2018 his TFTs were better so we did not restart methimazole.  I advised him to come back for labs in 1.5 months, but he did not do so.  He continues to feel well, without complaints.  Reviewed his TFTs: Lab Results  Component Value Date   TSH 0.02 (L) 11/22/2018   TSH <0.01 (L) 11/08/2018   TSH <0.01 (L) 09/06/2018   TSH <0.01 (L) 07/22/2018   TSH 0.04 (L) 04/19/2018   FREET4 1.7 11/22/2018   FREET4 1.44 11/08/2018   FREET4 1.59 09/06/2018   FREET4 1.66 (H) 07/22/2018   FREET4 1.43 04/19/2018   T3FREE 4.1 11/22/2018   T3FREE 3.8 11/08/2018   T3FREE 3.9 09/06/2018   T3FREE 4.1 07/22/2018   T3FREE 4.0 04/19/2018  11/10/2017: TSH 0.008, free T4 1.96 (0.82-1.77): Free T3 4.33 (2.0-4.4)  His Graves' antibodies were not elevated: Lab Results  Component Value Date   TSI 123 04/19/2018   Pt denies: - feeling nodules in neck - hoarseness - dysphagia - choking - SOB with lying down  Pt does not have a FH of thyroid ds. No FH of thyroid cancer. No h/o radiation tx to head or neck.  No seaweed or kelp. No recent contrast studies. No herbal supplements. No Biotin  use. No recent steroids use.   He has no other medical history.  ROS: Constitutional: no weight gain/no weight loss, no fatigue, no subjective hyperthermia - improved sweating, no subjective hypothermia Eyes: no blurry vision, no xerophthalmia ENT: no sore throat, + see HPI, + epistaxis x 2 since last OV Cardiovascular: no CP/no SOB/no palpitations/no leg swelling Respiratory: no cough/no SOB/no wheezing Gastrointestinal: no N/no V/no D/no C/no acid reflux Musculoskeletal: no muscle aches/no joint aches Skin: no rashes, no hair loss Neurological: no tremors/no numbness/no tingling/no dizziness  I reviewed pt's  medications, allergies, PMH, social hx, family hx, and changes were documented in the history of present illness. Otherwise, unchanged from my initial visit note.  PMH - see HPI  Social History   Socioeconomic History  . Marital status: Married    Spouse name: Not on file  . Number of children: Not on file  . Years of education: Not on file  . Highest education level: Not on file  Occupational History  . Not on file  Tobacco Use  . Smoking status: Never Smoker  . Smokeless tobacco: Never Used  Substance and Sexual Activity  . Alcohol use: Not on file  . Drug use: Not on file  . Sexual activity: Not on file  Other Topics Concern  . Not on file  Social History Narrative  . Not on file   Social Determinants of Health   Financial Resource Strain:   . Difficulty of Paying Living Expenses: Not on file  Food Insecurity:   . Worried About Programme researcher, broadcasting/film/video in the Last Year: Not on file  . Ran Out of Food in the Last Year: Not on file  Transportation Needs:   . Lack of Transportation (Medical): Not on file  . Lack of Transportation (Non-Medical): Not on file  Physical Activity:   . Days of Exercise per Week: Not on file  . Minutes of Exercise per Session: Not on file  Stress:   . Feeling of Stress : Not on file  Social Connections:   . Frequency of Communication with Friends and Family: Not on file  . Frequency of Social Gatherings with Friends and Family: Not on file  . Attends Religious Services: Not on file  . Active Member of Clubs or Organizations: Not on file  . Attends Banker Meetings: Not on file  . Marital Status: Not on file  Intimate Partner Violence:   . Fear of Current or Ex-Partner: Not on file  . Emotionally Abused: Not on file  . Physically Abused: Not on file  . Sexually Abused: Not on file   Current Outpatient Medications on File Prior to Visit  Medication Sig Dispense Refill  . methimazole (TAPAZOLE) 5 MG tablet Take 1 tablet (5 mg  total) by mouth daily. (Patient not taking: Reported on 11/22/2018) 60 tablet 3   No current facility-administered medications on file prior to visit.   No Known Allergies   No family history on file.  PE: BP 120/78   Pulse 90   Ht 5\' 6"  (1.676 m)   Wt 219 lb (99.3 kg)   SpO2 99%   BMI 35.35 kg/m  Wt Readings from Last 3 Encounters:  03/28/19 219 lb (99.3 kg)  11/22/18 218 lb (98.9 kg)  07/22/18 214 lb (97.1 kg)   Constitutional: overweight, in NAD Eyes: PERRLA, EOMI, no exophthalmos ENT: moist mucous membranes, no thyromegaly, no cervical lymphadenopathy Cardiovascular: RRR, No MRG Respiratory: CTA B Gastrointestinal: abdomen soft, NT, ND,  BS+ Musculoskeletal: no deformities, strength intact in all 4 Skin: moist, warm, no rashes Neurological: no tremor with outstretched hands, DTR normal in all 4  ASSESSMENT: 1. Graves ds. - uncontrolled  PLAN:  1. Patient with history of Graves' disease with uncontrolled TFTs, which initial thyrotoxic symptoms; weight loss, heat intolerance, both resolved.  He was started on methimazole and he was on high doses for a year: Up to 20 mg 3 times a day but he stopped the medication at the end of 2019.  He mentioned that he felt better off it.  In 03/2018, TFTs were improved and TSI antibodies were not elevated.  We did not restart methimazole but I advised him to come back for labs in 1.5 months.  He did not return, and when he came for his appointment in 07/2018, TFTs were again thyrotoxic.  We started 5 mg of methimazole daily and he took this for a month, but ran out of prescriptions and could not refill it due to an apparent problem with the pharmacy.  At last visit, his TSH was suppressed but was improved, now detectable and his free thyroid hormones were normal.  Since he was asymptomatic, I advised him to stay off the methimazole but asked him to come back for labs in 1.5 months.  He did not return. -At this visit, we again discussed about  the absolute importance of presenting for labs to make sure that his thyroid tests are controlled while we are changing the methimazole doses. -We will recheck his TFTs today and decide about methimazole dose when these return -We again discussed about definitive treatment for his Graves' disease with RAI treatment or thyroidectomy and the need for levothyroxine afterwards. For now, we decided to retry MMI if thyrotoxicosis is worse - he denies any sxs and mentions his chronic excess sweating is improved -At this visit, he has no signs of Graves' ophthalmopathy: No blurry vision, no double vision, no chemosis, no eye pain -I will see him back in 4 months, but I am really hoping that he will present sooner for labs, if needed.  I again strongly encouraged him to join my chart so we can stay in touch through the patient portal.  Needs refills.  Office Visit on 03/28/2019  Component Date Value Ref Range Status  . T3, Free 03/28/2019 5.3* 2.0 - 4.4 pg/mL Final  . Free T4 03/28/2019 2.32* 0.82 - 1.77 ng/dL Final  . TSH 03/28/2019 0.024* 0.450 - 4.500 uIU/mL Final   Thyroid tests are worse >> will restart MMI 5 mg daily and schedule him to come back for labs in ~5  weeks. Philemon Kingdom, MD PhD [facility] Endocrinology

## 2019-03-28 NOTE — Patient Instructions (Signed)
Please continue off Methimazole.  Please stop at the lab.  Please come back for a follow-up appointment in 4 months.

## 2019-03-29 LAB — T4, FREE: Free T4: 2.32 ng/dL — ABNORMAL HIGH (ref 0.82–1.77)

## 2019-03-29 LAB — T3, FREE: T3, Free: 5.3 pg/mL — ABNORMAL HIGH (ref 2.0–4.4)

## 2019-03-29 LAB — TSH: TSH: 0.024 u[IU]/mL — ABNORMAL LOW (ref 0.450–4.500)

## 2019-03-30 MED ORDER — METHIMAZOLE 5 MG PO TABS: 5.0000 mg | ORAL_TABLET | Freq: Every day | ORAL | 3 refills | Status: DC

## 2019-03-31 ENCOUNTER — Telehealth: Payer: Self-pay

## 2019-03-31 NOTE — Telephone Encounter (Signed)
-----   Message from Carlus Pavlov, MD sent at 03/30/2019 12:05 PM EST ----- Efraim Kaufmann, can you please call pt: Tyler Gutierrez tests are a little worse >> let's restart MMI 5 mg daily and please schedule him to come back for labs in ~ 5  weeks.

## 2019-04-02 NOTE — Telephone Encounter (Signed)
Notified patient of message from Dr. Gherghe, patient expressed understanding and agreement. No further questions.  

## 2019-05-31 ENCOUNTER — Other Ambulatory Visit: Payer: Self-pay

## 2019-05-31 ENCOUNTER — Ambulatory Visit: Payer: Commercial Managed Care - PPO | Attending: Internal Medicine

## 2019-05-31 DIAGNOSIS — Z23 Encounter for immunization: Secondary | ICD-10-CM

## 2019-05-31 NOTE — Progress Notes (Signed)
   Covid-19 Vaccination Clinic  Name:  Jayde Daffin    MRN: [id number] DOB: 15-Oct-1955  05/31/2019  Mr. Bougie was observed post Covid-19 immunization for 15 minutes without incident. He was provided with Vaccine Information Sheet and instruction to access the V-Safe system.   Mr. Lalla was instructed to call 911 with any severe reactions post vaccine: Marland Kitchen Difficulty breathing  . Swelling of face and throat  . A fast heartbeat  . A bad rash all over body  . Dizziness and weakness   Immunizations Administered    Name Date Dose VIS Date Route   Pfizer COVID-19 Vaccine 05/31/2019  5:06 PM 0.3 mL 02/21/2019 Intramuscular   Manufacturer: [employer], [employer]   Lot: [id number]   NDC: [address]-9

## 2019-06-06 ENCOUNTER — Other Ambulatory Visit (INDEPENDENT_AMBULATORY_CARE_PROVIDER_SITE_OTHER): Payer: Commercial Managed Care - PPO

## 2019-06-06 ENCOUNTER — Other Ambulatory Visit: Payer: Self-pay

## 2019-06-06 DIAGNOSIS — E0500 Thyrotoxicosis with diffuse goiter without thyrotoxic crisis or storm: Secondary | ICD-10-CM | POA: Diagnosis not present

## 2019-06-06 LAB — TSH: TSH: 0.54 u[IU]/mL (ref 0.35–4.50)

## 2019-06-06 LAB — T4, FREE: Free T4: 1.1 ng/dL (ref 0.60–1.60)

## 2019-06-06 LAB — T3, FREE: T3, Free: 3.6 pg/mL (ref 2.3–4.2)

## 2019-06-21 ENCOUNTER — Ambulatory Visit: Payer: Commercial Managed Care - PPO | Attending: Internal Medicine

## 2019-06-21 ENCOUNTER — Other Ambulatory Visit: Payer: Self-pay

## 2019-06-21 DIAGNOSIS — Z23 Encounter for immunization: Secondary | ICD-10-CM

## 2019-06-21 NOTE — Progress Notes (Signed)
   Covid-19 Vaccination Clinic  Name:  Izea Livolsi    MRN: [id number] DOB: 1955-10-16  06/21/2019  Mr. Bertsch was observed post Covid-19 immunization for 15 minutes without incident. He was provided with Vaccine Information Sheet and instruction to access the V-Safe system.   Mr. Devery was instructed to call 911 with any severe reactions post vaccine: Marland Kitchen Difficulty breathing  . Swelling of face and throat  . A fast heartbeat  . A bad rash all over body  . Dizziness and weakness   Immunizations Administered    Name Date Dose VIS Date Route   Pfizer COVID-19 Vaccine 06/21/2019  4:17 PM 0.3 mL 02/21/2019 Intramuscular   Manufacturer: [employer], [employer]   Lot: [id number]   NDC: [address]-3

## 2019-08-01 ENCOUNTER — Encounter: Payer: Self-pay | Admitting: Internal Medicine

## 2019-08-01 ENCOUNTER — Other Ambulatory Visit: Payer: Self-pay

## 2019-08-01 ENCOUNTER — Ambulatory Visit: Payer: Commercial Managed Care - PPO | Admitting: Internal Medicine

## 2019-08-01 VITALS — BP 120/70 | HR 72 | Ht 66.0 in | Wt 225.0 lb

## 2019-08-01 DIAGNOSIS — E0500 Thyrotoxicosis with diffuse goiter without thyrotoxic crisis or storm: Secondary | ICD-10-CM

## 2019-08-01 MED ORDER — METHIMAZOLE 5 MG PO TABS: 5.0000 mg | ORAL_TABLET | Freq: Every day | ORAL | 3 refills | Status: DC

## 2019-08-01 NOTE — Patient Instructions (Addendum)
Please stop at the lab.  Continue Methimazole 5 mg daily.  Please come back for a follow-up appointment in 6 months.  

## 2019-08-01 NOTE — Progress Notes (Signed)
Patient ID: Tyler Gutierrez, male   DOB: 1955-08-24, 64 y.o.   MRN: [id number]   This visit occurred during the SARS-CoV-2 public health emergency.  Safety protocols were in place, including screening questions prior to the visit, additional usage of staff PPE, and extensive cleaning of exam room while observing appropriate contact time as indicated for disinfecting solutions.   HPI  Tyler Gutierrez is a 64 y.o.-year-old male, initially referred by his PCP, Dr. Purnell Shoemaker, returning for follow-up for Graves disease.  Last visit 4 months ago.  Reviewed and addended history: He was dx'ed with hyperthyroidism in 2016 by screening at work. Retrospectively, at that time, he lost 20 lbs in 3-4 mo; increased heat intolerance and sweating.  12/31/2014: Thyroid uptake and scan: Consistent with Graves' disease: Markedly diffuse increased uptake throughout both lobes of the thyroid gland identified. No dominant hot or cold nodules. The 4 hour radioactive iodine uptake is equal to 89.5%. The 24 hour radioactive iodine uptake is equal to 99.7%.  He saw hs PCP >> started MMI >> lately 20 mg 3x a day x last year.  However, he was not very compliant with his medication.  TFTs remained uncontrolled.  PCP decided to refer him to endocrinology at that time.  He ran out MMI in the first week of 03/2018.  At last visit, he was off the medication for approximately 1 month.  He felt that his heat intolerance improved after he stopped methimazole.  At that time, we checked his TFTs and they were much improved, with now normal free thyroid hormones and improved TSH.  His TSI antibodies were not elevated.  Therefore, I advised him to stay off methimazole pending repeat TFTs in 1.5 months.  However, he did not return for labs.  In 07/2018, his TSH was still low and he is free T4 was elevated.  We started methimazole 5 mg daily in 07/2018 with improvement of his TFTs in 08/2018 but TSH remained undetectable  in 10/2018 and I advised him to increase the dose to 7.5 mg daily in 10/2018.  In 11/2018 he told me that he ran out of methimazole in 08/2018 due to lack of refills.  He did not let me know.  However, in 11/2018 his TFTs were better so we did not restart methimazole.  I advised him to come back for labs in 1.5 months, but he did not do so.  At our visit in 03/2019, we had to restart methimazole 5 mg daily.  His TFTs were in the thyrotoxic range.  Subsequent TFTs were normal in 05/2019.  We continued the same dose of methimazole - run out 2 days ago.  He continues to feel well, without complaints other than sore throat in the last few days and some weight gain.  Reviewed his TFTs: Lab Results  Component Value Date   TSH 0.54 06/06/2019   TSH 0.024 (L) 03/28/2019   TSH 0.02 (L) 11/22/2018   TSH <0.01 (L) 11/08/2018   TSH <0.01 (L) 09/06/2018   TSH <0.01 (L) 07/22/2018   TSH 0.04 (L) 04/19/2018   FREET4 1.10 06/06/2019   FREET4 2.32 (H) 03/28/2019   FREET4 1.7 11/22/2018   FREET4 1.44 11/08/2018   FREET4 1.59 09/06/2018   FREET4 1.66 (H) 07/22/2018   FREET4 1.43 04/19/2018   T3FREE 3.6 06/06/2019   T3FREE 5.3 (H) 03/28/2019   T3FREE 4.1 11/22/2018   T3FREE 3.8 11/08/2018   T3FREE 3.9 09/06/2018   T3FREE 4.1 07/22/2018   T3FREE 4.0  04/19/2018  11/10/2017: TSH 0.008, free T4 1.96 (0.82-1.77): Free T3 4.33 (2.0-4.4)  His greatest antibodies were not elevated: Lab Results  Component Value Date   TSI 123 04/19/2018   Pt denies: - feeling nodules in neck - dysphagia - choking - SOB with lying down + sore throat in last few days. Also hoarseness.  Pt does not have a FH of thyroid ds. No FH of thyroid cancer. No h/o radiation tx to head or neck.  No herbal supplements. No Biotin use. No recent steroids use.   He has no other medical history.  ROS: Constitutional: + weight gain/no weight loss, no fatigue, no subjective hyperthermia, no subjective hypothermia Eyes: no blurry  vision, no xerophthalmia ENT: + sore throat, + see HPI Cardiovascular: no CP/no SOB/no palpitations/no leg swelling Respiratory: no cough/no SOB/no wheezing Gastrointestinal: no N/no V/no D/no C/no acid reflux Musculoskeletal: no muscle aches/no joint aches Skin: no rashes, no hair loss Neurological: no tremors/no numbness/no tingling/no dizziness  I reviewed pt's medications, allergies, PMH, social hx, family hx, and changes were documented in the history of present illness. Otherwise, unchanged from my initial visit note.  PMH - see HPI  Social History   Socioeconomic History  . Marital status: Married    Spouse name: Not on file  . Number of children: Not on file  . Years of education: Not on file  . Highest education level: Not on file  Occupational History  . Not on file  Tobacco Use  . Smoking status: Never Smoker  . Smokeless tobacco: Never Used  Substance and Sexual Activity  . Alcohol use: Not on file  . Drug use: Not on file  . Sexual activity: Not on file  Other Topics Concern  . Not on file  Social History Narrative  . Not on file   Social Determinants of Health   Financial Resource Strain:   . Difficulty of Paying Living Expenses:   Food Insecurity:   . Worried About Charity fundraiser in the Last Year:   . Arboriculturist in the Last Year:   Transportation Needs:   . Film/video editor (Medical):   Marland Kitchen Lack of Transportation (Non-Medical):   Physical Activity:   . Days of Exercise per Week:   . Minutes of Exercise per Session:   Stress:   . Feeling of Stress :   Social Connections:   . Frequency of Communication with Friends and Family:   . Frequency of Social Gatherings with Friends and Family:   . Attends Religious Services:   . Active Member of Clubs or Organizations:   . Attends Archivist Meetings:   Marland Kitchen Marital Status:   Intimate Partner Violence:   . Fear of Current or Ex-Partner:   . Emotionally Abused:   Marland Kitchen Physically  Abused:   . Sexually Abused:    Current Outpatient Medications on File Prior to Visit  Medication Sig Dispense Refill  . methimazole (TAPAZOLE) 5 MG tablet Take 1 tablet (5 mg total) by mouth daily. 90 tablet 3   No current facility-administered medications on file prior to visit.   No Known Allergies   No family history on file.  PE: BP 120/70   Pulse 72   Ht 5\' 6"  (1.676 m)   Wt 225 lb (102.1 kg)   SpO2 99%   BMI 36.32 kg/m  Wt Readings from Last 3 Encounters:  08/01/19 225 lb (102.1 kg)  03/28/19 219 lb (99.3 kg)  11/22/18 218  lb (98.9 kg)   Constitutional: overweight, in NAD Eyes: PERRLA, EOMI, no exophthalmos ENT: moist mucous membranes, no thyromegaly, no cervical lymphadenopathy Cardiovascular: RRR, No MRG Respiratory: CTA B Gastrointestinal: abdomen soft, NT, ND, BS+ Musculoskeletal: no deformities, strength intact in all 4 Skin: moist, warm, no rashes Neurological: no tremor with outstretched hands, DTR normal in all 4  ASSESSMENT: 1. Graves ds. - uncontrolled  PLAN:  1. Patient with history of Graves' disease, with uncontrolled TFTs, with initial thyrotoxic symptoms: Weight loss, heat intolerance, both resolved.  He was started on methimazole and he was on a high dose for a year: Up to 20 mg 3 times a day, after which she stopped the medication at the end of 2019.  He mentions that he felt better off of it.  In 03/2018, TFTs were improved and TSI antibodies were not elevated.  We did not restart methimazole but advised him to come back for labs in 1.5 months.  He did not return and when he came for his appointment in 07/2018, TFTs were again thyrotoxic.  We started 5 mg of methimazole daily and he took this for a month but ran out of the prescription and could not refill it due to an apparent problem with the pharmacy.  At last visit, in 03/2019, his TFTs were again thyrotoxic, so we restarted methimazole 5 mg daily.  In 05/2019, his TFTs were normal.  We continued  the same dose of methimazole. -At last visit and again today we discussed about the absolute importance of taking the medication as prescribed and coming for labs.  I again advised him to join my chart and he agrees to do so. -He has no complaints at this visit, but gained 6 lbs since 4 mo ago -We will check his TFTs and adjust the methimazole dose accordingly -We again discussed that definitive treatment for Graves' disease is RAI treatment versus thyroidectomy but he will need to be on levothyroxine afterwards and this may be a problem if he is not compliant.  For now, we will continue with methimazole.  Again advised him not to run out of the medication especially as he has refills for an entire year.  We discussed about staying on the methimazole may be a little bit longer to avoid Graves' disease recurrence.  He agrees. -He describes no signs of Graves' ophthalmopathy: No blurry vision, no double vision, no chemosis, no eye pain -I will see him back in 6 months  Component     Latest Ref Rng & Units 08/01/2019  TSH     0.40 - 4.50 mIU/L 1.28  T4,Free(Direct)     0.8 - 1.8 ng/dL 1.5  Triiodothyronine,Free,Serum     2.3 - 4.2 pg/mL 3.7   Thyroid tests are normal.  We will continue methimazole for now.   Carlus Pavlov, MD PhD [facility] Endocrinology

## 2019-08-02 LAB — T3, FREE: T3, Free: 3.7 pg/mL (ref 2.3–4.2)

## 2019-08-02 LAB — TSH: TSH: 1.28 mIU/L (ref 0.40–4.50)

## 2019-08-02 LAB — T4, FREE: Free T4: 1.5 ng/dL (ref 0.8–1.8)

## 2019-10-17 ENCOUNTER — Other Ambulatory Visit (INDEPENDENT_AMBULATORY_CARE_PROVIDER_SITE_OTHER): Payer: Commercial Managed Care - PPO

## 2019-10-17 ENCOUNTER — Other Ambulatory Visit: Payer: Self-pay

## 2019-10-17 DIAGNOSIS — E0500 Thyrotoxicosis with diffuse goiter without thyrotoxic crisis or storm: Secondary | ICD-10-CM | POA: Diagnosis not present

## 2019-10-17 LAB — T3, FREE: T3, Free: 3.4 pg/mL (ref 2.3–4.2)

## 2019-10-17 LAB — T4, FREE: Free T4: 1.17 ng/dL (ref 0.60–1.60)

## 2019-10-17 LAB — TSH: TSH: 1.45 u[IU]/mL (ref 0.35–4.50)

## 2019-10-28 ENCOUNTER — Telehealth: Payer: Self-pay

## 2019-10-28 MED ORDER — METHIMAZOLE 5 MG PO TABS: 2.5000 mg | ORAL_TABLET | Freq: Every day | ORAL | 3 refills | Status: DC

## 2019-10-28 NOTE — Addendum Note (Signed)
Addended by: Darliss Ridgel I on: 10/28/2019 04:39 PM   Modules accepted: Orders

## 2019-10-28 NOTE — Telephone Encounter (Signed)
Notified patient of message from Dr. Elvera Lennox, patient expressed understanding and agreement. No further questions.  Patient will take half a tablet a day, he currently has plenty and will call us when he gets close to needing a refill and we will send it in.  Prescription has been updated.

## 2019-10-28 NOTE — Telephone Encounter (Signed)
RESULTS  Results were reviewed by Dr. Gherghe. Called pt to inform about results as well as new orders. LVM requesting returned call.  

## 2019-10-28 NOTE — Telephone Encounter (Signed)
-----   Message from Carlus Pavlov, MD sent at 10/27/2019  1:02 PM EDT ----- Efraim Kaufmann, can you please call pt: His thyroid tests are excellent.  We can try to decrease the methimazole to half a tablet daily or 1 tablet every other day.  Can you please change this in his medication list? I will recheck his tests when he comes back in November.

## 2019-12-19 ENCOUNTER — Other Ambulatory Visit (INDEPENDENT_AMBULATORY_CARE_PROVIDER_SITE_OTHER): Payer: Commercial Managed Care - PPO

## 2019-12-19 ENCOUNTER — Other Ambulatory Visit: Payer: Self-pay

## 2019-12-19 ENCOUNTER — Other Ambulatory Visit: Payer: Self-pay | Admitting: Internal Medicine

## 2019-12-19 DIAGNOSIS — E0500 Thyrotoxicosis with diffuse goiter without thyrotoxic crisis or storm: Secondary | ICD-10-CM

## 2019-12-19 LAB — T4, FREE: Free T4: 1.09 ng/dL (ref 0.60–1.60)

## 2019-12-19 LAB — TSH: TSH: 1.09 u[IU]/mL (ref 0.35–4.50)

## 2019-12-19 LAB — T3, FREE: T3, Free: 3.6 pg/mL (ref 2.3–4.2)

## 2019-12-22 ENCOUNTER — Telehealth: Payer: Self-pay

## 2019-12-22 NOTE — Telephone Encounter (Signed)
-----   Message from Carlus Pavlov, MD sent at 12/22/2019  1:25 PM EDT ----- Can you please call patient: His thyroid tests are excellent.  Please continue the same dose of methimazole until he is next visit in November.

## 2019-12-22 NOTE — Telephone Encounter (Signed)
Left detailed message regarding results and recommendations. 

## 2020-01-30 ENCOUNTER — Ambulatory Visit: Payer: Commercial Managed Care - PPO | Admitting: Internal Medicine

## 2020-02-15 ENCOUNTER — Other Ambulatory Visit: Payer: Self-pay

## 2020-02-15 ENCOUNTER — Emergency Department (HOSPITAL_COMMUNITY)
Admission: EM | Admit: 2020-02-15 | Discharge: 2020-02-15 | Disposition: A | Discharge status: Home / Self Care | Payer: Commercial Managed Care - PPO | Attending: Emergency Medicine | Admitting: Emergency Medicine

## 2020-02-15 ENCOUNTER — Encounter (HOSPITAL_COMMUNITY): Payer: Self-pay | Admitting: *Deleted

## 2020-02-15 ENCOUNTER — Emergency Department (HOSPITAL_COMMUNITY): Payer: Commercial Managed Care - PPO

## 2020-02-15 DIAGNOSIS — M545 Low back pain, unspecified: Secondary | ICD-10-CM | POA: Diagnosis present

## 2020-02-15 DIAGNOSIS — M5441 Lumbago with sciatica, right side: Secondary | ICD-10-CM | POA: Diagnosis not present

## 2020-02-15 MED ORDER — LIDOCAINE 5 % EX PTCH: 1.0000 | MEDICATED_PATCH | CUTANEOUS | 0 refills | Status: DC

## 2020-02-15 MED ORDER — METHOCARBAMOL 500 MG PO TABS: 500.0000 mg | ORAL_TABLET | Freq: Two times a day (BID) | ORAL | 0 refills | Status: DC

## 2020-02-15 MED ORDER — HYDROCODONE-ACETAMINOPHEN 5-325 MG PO TABS
1.0000 | ORAL_TABLET | ORAL | 0 refills | Status: DC | PRN
Start: 2020-02-15 — End: 2021-03-14

## 2020-02-15 MED ORDER — PREDNISONE 50 MG PO TABS: 50.0000 mg | ORAL_TABLET | Freq: Every day | ORAL | 0 refills | Status: AC

## 2020-02-15 NOTE — ED Provider Notes (Signed)
[name] EMERGENCY DEPARTMENT Provider Note   CSN: [id number] Arrival date & time: 02/15/20  1001     History Chief Complaint  Patient presents with  . Leg Pain    Tyler Gutierrez is a 64 y.o. male with past medical history who presents for evaluation of back pain.  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  Patient states he stood up and had a pain to right lower back.  States since then the pain radiates into his right medial aspect of thigh, terminates at his knee.  Pain worse with ambulation.  History IV drug use, bowel or bladder incontinence, saddle paresthesia, malignancy.  Patient states he feels like he has a "tightening to his thigh."  He has tried icy hot, ice at home.  Denies fever, chills, nausea, vomiting, chest pain, shortness of breath abdominal pain, diarrhea, dysuria, paresthesias, weakness, unilateral leg swelling, redness or warmth.  Denies additional aggravating or alleviating factors.  He rates his current pain a 6/10.  He does not want anything for pain at this time. Had something similar many years ago which resolved with NSAIDs  History obtained from patient and past medical records. No interpretor was used.  HPI     History reviewed. No pertinent past medical history.  Patient Active Problem List   Diagnosis Date Noted  . Graves disease 04/19/2018    History reviewed. No pertinent surgical history.     No family history on file.  Social History   Tobacco Use  . Smoking status: Never Smoker  . Smokeless tobacco: Never Used  Vaping Use  . Vaping Use: Never used  Substance Use Topics  . Alcohol use: Never  . Drug use: Never    Home Medications Prior to Admission medications   Medication Sig Start Date End Date Taking? Authorizing Provider  HYDROcodone-acetaminophen (NORCO/VICODIN) 5-325 MG tablet Take 1 tablet by mouth every 4 (four) hours as needed. 02/15/20   Dontay Harm A, PA-C    lidocaine (LIDODERM) 5 % Place 1 patch onto the skin daily. Remove & Discard patch within 12 hours or as directed by MD 02/15/20   Ryder Chesmore A, PA-C  methimazole (TAPAZOLE) 5 MG tablet Take 0.5 tablets (2.5 mg total) by mouth daily. 10/28/19   Carlus Pavlov, MD  methocarbamol (ROBAXIN) 500 MG tablet Take 1 tablet (500 mg total) by mouth 2 (two) times daily. 02/15/20   Remo Kirschenmann A, PA-C  predniSONE (DELTASONE) 50 MG tablet Take 1 tablet (50 mg total) by mouth daily for 5 days. 02/15/20 02/20/20  Arielys Wandersee A, PA-C    Allergies    Patient has no known allergies.  Review of Systems   Review of Systems  Constitutional: Negative.   HENT: Negative.   Respiratory: Negative.   Cardiovascular: Negative.   Genitourinary: Negative.   Musculoskeletal: Positive for back pain. Negative for arthralgias, gait problem, joint swelling, myalgias, neck pain and neck stiffness.  Skin: Negative.   Neurological: Negative.   All other systems reviewed and are negative.   Physical Exam Updated Vital Signs BP (!) 138/102 (BP Location: Right Arm)   Pulse 88   Temp 98.3 F (36.8 C) (Oral)   Resp 20   Ht 5\' 7"  (1.702 m)   Wt 99.8 kg   SpO2 100%   BMI 34.46 kg/m   Physical Exam  Physical Exam  Constitutional: Pt appears well-developed and well-nourished. No distress.  HENT:  Head: Normocephalic and atraumatic.  Mouth/Throat: Oropharynx is clear and moist. No oropharyngeal exudate.  Eyes: Conjunctivae are normal.  Neck: Normal range of motion. Neck supple.  Full ROM without pain  Cardiovascular: Normal rate, regular rhythm and intact distal pulses.   Pulmonary/Chest: Effort normal and breath sounds normal. No respiratory distress. Pt has no wheezes.  Abdominal: Soft. Pt exhibits no distension. There is no tenderness, rebound or guarding. No abd bruit or pulsatile mass Musculoskeletal:  Full range of motion of the T-spine and L-spine with flexion, hyperextension, and lateral  flexion. No midline tenderness or stepoffs. No tenderness to palpation of the spinous processes of the T-spine or L-spine. Mild tenderness to palpation of the paraspinous muscles of the L-spine on RIGHT. Positive straight leg raise on right.  Tenderness over right piriformis, SI Lymphadenopathy:    Pt has no cervical adenopathy.  Neurological: Pt is alert. Pt has normal reflexes.  Reflex Scores:      Bicep reflexes are 2+ on the right side and 2+ on the left side.      Brachioradialis reflexes are 2+ on the right side and 2+ on the left side.      Patellar reflexes are 2+ on the right side and 2+ on the left side.      Achilles reflexes are 2+ on the right side and 2+ on the left side. Speech is clear and goal oriented, follows commands Normal 5/5 strength in upper and lower extremities bilaterally including dorsiflexion and plantar flexion, strong and equal grip strength Sensation normal to light and sharp touch Moves extremities without ataxia, coordination intact Normal gait Normal balance No Clonus Skin: Skin is warm and dry. No rash noted or lesions noted. Pt is not diaphoretic. No erythema, ecchymosis,edema or warmth.  Psychiatric: Pt has a normal mood and affect. Behavior is normal.  Nursing note and vitals reviewed. ED Results / Procedures / Treatments   Labs (all labs ordered are listed, but only abnormal results are displayed) Labs Reviewed - No data to display  EKG None  Radiology DG Lumbar Spine Complete  Result Date: 02/15/2020 CLINICAL DATA:  Back pain. EXAM: LUMBAR SPINE - COMPLETE 4+ VIEW COMPARISON:  None. FINDINGS: No fracture. No subluxation. Mild loss of disc height noted L3-4, L4-5 common L5-S1 with associated endplate spurring. SI joints and symphysis pubis unremarkable. The facets are well aligned bilaterally. IMPRESSION: Degenerative disc disease lower lumbar spine. No acute bony abnormality. Electronically Signed   By: Kennith Center M.D.   On: 02/15/2020 12:03     Procedures Procedures (including critical care time)  Medications Ordered in ED Medications - No data to display  ED Course  I have reviewed the triage vital signs and the nursing notes.  Pertinent labs & imaging results that were available during my care of the patient were reviewed by me and considered in my medical decision making (see chart for details).  64 year old presents for evaluation of back pain.  Began 1 week ago after bending down doing some construction work.  He is afebrile, nonseptic, non-ill-appearing.  Pain worse with bending, twisting and standing.  Has been trying OTC NSAIDs, icy hot without relief.  Apparently has spasms to his right thigh.  No lateral leg swelling, redness or warmth.  Apparently triage note said right leg swelling however patient denies this he states he gets spasms to his right anterior thigh however no leg swelling which is consistent with his exam.  No overlying skin changes.  No clinical evidence of DVT on exam.  His  compartments are soft.  Does have positive straight leg raise on the right.  May be able to reproduce his pain with palpation to his right lower back, over piriformis.  No history of IV drug use, bowel or bladder incontinence, saddle paresthesia, malignancy.  He has no urinary complaints. He is ambulatory here in ED.  Will get lumbar xray and reassess.  DG lumbar shows degenerative changes I personally reviewed and interpreted.  Patient symptoms likely consistent with sciatica.  I have low suspicion for bacterial infectious process, septic joint, gout, hemarthrosis, VTE, rhabdomyolysis, myositis.  Low suspicion for acute neurosurgical emergency such as cauda equina, discitis, osteomyelitis, transverse myelitis, psoas abscess. Ambulatory without difficulty.  Will DC home with symptomatic management.  Will refer outpatient for further follow-up.  He will return for any worsening symptoms.  The patient has been appropriately medically  screened and/or stabilized in the ED. I have low suspicion for any other emergent medical condition which would require further screening, evaluation or treatment in the ED or require inpatient management.  Patient is hemodynamically stable and in no acute distress.  Patient able to ambulate in department prior to ED.  Evaluation does not show acute pathology that would require ongoing or additional emergent interventions while in the emergency department or further inpatient treatment.  I have discussed the diagnosis with the patient and answered all questions.  Pain is been managed while in the emergency department and patient has no further complaints prior to discharge.  Patient is comfortable with plan discussed in room and is stable for discharge at this time.  I have discussed strict return precautions for returning to the emergency department.  Patient was encouraged to follow-up with PCP/specialist refer to at discharge.     MDM Rules/Calculators/A&P                           Final Clinical Impression(s) / ED Diagnoses Final diagnoses:  Acute right-sided low back pain with right-sided sciatica    Rx / DC Orders ED Discharge Orders         Ordered    predniSONE (DELTASONE) 50 MG tablet  Daily        02/15/20 1246    methocarbamol (ROBAXIN) 500 MG tablet  2 times daily        02/15/20 1246    lidocaine (LIDODERM) 5 %  Every 24 hours        02/15/20 1246    HYDROcodone-acetaminophen (NORCO/VICODIN) 5-325 MG tablet  Every 4 hours PRN        02/15/20 1246           Reis Pienta A, PA-C 02/15/20 1248    Bethann Berkshire, MD 02/16/20 [phone]

## 2020-02-15 NOTE — Discharge Instructions (Signed)
Your x-ray showed degenerative changes to your lumbar back however no acute fractures.  Take the medications as prescribed  Use the lidocaine patches, placed to your right lower back.  Leave for 12 hours and then replace after being patch free for 12 hours. The Robaxin is used as a muscle relaxer. The Norco is used as a pain medication.  Do not drive or operate heavy machinery while taking this medication.  This medication may become addictive. Take the steroids to decrease the inflammation.  Return if you have any new worsening symptoms.

## 2020-02-15 NOTE — ED Triage Notes (Signed)
Pt c/o right lower back pain that radiates to right inner thigh down to knee since last Saturday. Pt also c/o swelling to right thigh. Pt reports pain started after he was down on his knees for about an hour working.

## 2020-02-26 ENCOUNTER — Encounter: Payer: Self-pay | Admitting: Internal Medicine

## 2020-02-27 ENCOUNTER — Other Ambulatory Visit: Payer: Self-pay

## 2020-02-27 ENCOUNTER — Encounter: Payer: Self-pay | Admitting: Internal Medicine

## 2020-02-27 ENCOUNTER — Ambulatory Visit: Payer: Commercial Managed Care - PPO | Admitting: Internal Medicine

## 2020-02-27 VITALS — BP 140/90 | HR 73 | Ht 67.0 in | Wt 227.0 lb

## 2020-02-27 DIAGNOSIS — E0500 Thyrotoxicosis with diffuse goiter without thyrotoxic crisis or storm: Secondary | ICD-10-CM | POA: Diagnosis not present

## 2020-02-27 LAB — TSH: TSH: 1.16 u[IU]/mL (ref 0.35–4.50)

## 2020-02-27 LAB — T3, FREE: T3, Free: 3.6 pg/mL (ref 2.3–4.2)

## 2020-02-27 LAB — T4, FREE: Free T4: 1.11 ng/dL (ref 0.60–1.60)

## 2020-02-27 NOTE — Progress Notes (Signed)
Patient ID: Tyler Gutierrez, male   DOB: 11-30-1955, 64 y.o.   MRN: [id number]   This visit occurred during the SARS-CoV-2 public health emergency.  Safety protocols were in place, including screening questions prior to the visit, additional usage of staff PPE, and extensive cleaning of exam room while observing appropriate contact time as indicated for disinfecting solutions.   HPI  Tyler Gutierrez is a 64 y.o.-year-old male, initially referred by his PCP, Dr. Purnell Shoemaker, returning for follow-up for Graves disease.  Last visit 7 months ago.  Reviewed and addended history: He was dx'ed with hyperthyroidism in 2016 by screening at work. Retrospectively, at that time, he lost 20 lbs in 3-4 mo; increased heat intolerance and sweating.  12/31/2014: Thyroid uptake and scan: Consistent with Graves' disease: Markedly diffuse increased uptake throughout both lobes of the thyroid gland identified. No dominant hot or cold nodules. The 4 hour radioactive iodine uptake is equal to 89.5%. The 24 hour radioactive iodine uptake is equal to 99.7%.  He saw hs PCP >> started MMI >> lately 20 mg 3x a day x last year.  However, he was not very compliant with his medication.  TFTs remained uncontrolled.  PCP decided to refer him to endocrinology at that time.  He ran out MMI in the first week of 03/2018 >> he was off the medication for approximately 1 month.  He felt that his heat intolerance improved after he stopped methimazole.  At that time, we checked his TFTs and they were much improved, with now normal free thyroid hormones and improved TSH.  His TSI antibodies were not elevated.  Therefore, I advised him to stay off methimazole pending repeat TFTs in 1.5 months.  However, he did not return for labs.  In 07/2018, his TSH was still low and he is free T4 was elevated.  We started methimazole 5 mg daily in 07/2018 with improvement of his TFTs in 08/2018 but TSH remained undetectable in 10/2018 and  I advised him to increase the dose to 7.5 mg daily in 10/2018.  In 11/2018 he told me that he ran out of methimazole in 08/2018 due to lack of refills.  He did not let me know.  However, in 11/2018 his TFTs were better so we did not restart methimazole.  I advised him to come back for labs in 1.5 months, but he did not do so.  In 03/2019, we had to restart methimazole 5 mg daily.  His TFTs were in the thyrotoxic range.  Subsequent TFTs were normal in 05/2019.  We continued the same dose of methimazole.  In 10/2019, TFTs were normal, so we decreased the methimazole dose to 2.5 mg daily.  In 12/2019, TFTs were normal.   Pt denies: - weight loss - heat intolerance - tremors - palpitations - anxiety - hyperdefecation - hair loss  Reviewed his TFTs: Lab Results  Component Value Date   TSH 1.09 12/19/2019   TSH 1.45 10/17/2019   TSH 1.28 08/01/2019   TSH 0.54 06/06/2019   TSH 0.024 (L) 03/28/2019   TSH 0.02 (L) 11/22/2018   TSH <0.01 (L) 11/08/2018   TSH <0.01 (L) 09/06/2018   TSH <0.01 (L) 07/22/2018   TSH 0.04 (L) 04/19/2018   FREET4 1.09 12/19/2019   FREET4 1.17 10/17/2019   FREET4 1.5 08/01/2019   FREET4 1.10 06/06/2019   FREET4 2.32 (H) 03/28/2019   FREET4 1.7 11/22/2018   FREET4 1.44 11/08/2018   FREET4 1.59 09/06/2018   FREET4 1.66 (H) 07/22/2018  FREET4 1.43 04/19/2018   T3FREE 3.6 12/19/2019   T3FREE 3.4 10/17/2019   T3FREE 3.7 08/01/2019   T3FREE 3.6 06/06/2019   T3FREE 5.3 (H) 03/28/2019   T3FREE 4.1 11/22/2018   T3FREE 3.8 11/08/2018   T3FREE 3.9 09/06/2018   T3FREE 4.1 07/22/2018   T3FREE 4.0 04/19/2018  11/10/2017: TSH 0.008, free T4 1.96 (0.82-1.77): Free T3 4.33 (2.0-4.4)  His TSI antibodies were not elevated: Lab Results  Component Value Date   TSI 123 04/19/2018   Pt denies: - feeling nodules in neck - hoarseness - dysphagia - choking - SOB with lying down  Pt does not have a FH of thyroid ds. No FH of thyroid cancer. No h/o radiation tx  to head or neck.  No seaweed or kelp. No recent contrast studies. No herbal supplements. No Biotin use. No recent steroids use.   He has no other medical history.  ROS: Constitutional: no weight gain/no weight loss, no fatigue, no subjective hyperthermia, no subjective hypothermia Eyes: no blurry vision, no xerophthalmia ENT: no sore throat, + see HPI Cardiovascular: no CP/no SOB/no palpitations/no leg swelling Respiratory: no cough/no SOB/no wheezing Gastrointestinal: no N/no V/no D/no C/no acid reflux Musculoskeletal: no muscle aches/no joint aches Skin: no rashes, no hair loss Neurological: no tremors/no numbness/no tingling/no dizziness  I reviewed pt's medications, allergies, PMH, social hx, family hx, and changes were documented in the history of present illness. Otherwise, unchanged from my initial visit note.  PMH: - see HPI  Social History   Socioeconomic History  . Marital status: Married    Spouse name: Not on file  . Number of children: Not on file  . Years of education: Not on file  . Highest education level: Not on file  Occupational History  . Not on file  Tobacco Use  . Smoking status: Never Smoker  . Smokeless tobacco: Never Used  Vaping Use  . Vaping Use: Never used  Substance and Sexual Activity  . Alcohol use: Never  . Drug use: Never  . Sexual activity: Not on file  Other Topics Concern  . Not on file  Social History Narrative  . Not on file   Social Determinants of Health   Financial Resource Strain: Not on file  Food Insecurity: Not on file  Transportation Needs: Not on file  Physical Activity: Not on file  Stress: Not on file  Social Connections: Not on file  Intimate Partner Violence: Not on file   Current Outpatient Medications on File Prior to Visit  Medication Sig Dispense Refill  . HYDROcodone-acetaminophen (NORCO/VICODIN) 5-325 MG tablet Take 1 tablet by mouth every 4 (four) hours as needed. 10 tablet 0  . lidocaine (LIDODERM) 5  % Place 1 patch onto the skin daily. Remove & Discard patch within 12 hours or as directed by MD 30 patch 0  . methimazole (TAPAZOLE) 5 MG tablet Take 0.5 tablets (2.5 mg total) by mouth daily. 90 tablet 3  . methocarbamol (ROBAXIN) 500 MG tablet Take 1 tablet (500 mg total) by mouth 2 (two) times daily. 20 tablet 0   No current facility-administered medications on file prior to visit.   No Known Allergies   No family history on file.  PE: BP 140/90   Pulse 73   Ht 5\' 7"  (1.702 m)   Wt 227 lb (103 kg)   SpO2 97%   BMI 35.55 kg/m  Wt Readings from Last 3 Encounters:  02/27/20 227 lb (103 kg)  02/15/20 220 lb (99.8 kg)  08/01/19 225 lb (102.1 kg)   Constitutional: overweight, in NAD Eyes: PERRLA, EOMI, no exophthalmos ENT: moist mucous membranes, no thyromegaly, no cervical lymphadenopathy Cardiovascular: RRR, No MRG Respiratory: CTA B Gastrointestinal: abdomen soft, NT, ND, BS+ Musculoskeletal: no deformities, strength intact in all 4 Skin: moist, warm, no rashes Neurological: no tremor with outstretched hands, DTR normal in all 4  ASSESSMENT: 1. Graves ds. - uncontrolled  PLAN:  1.  Patient with history of Graves' disease with uncontrolled TFTs, and with initial thyrotoxic symptoms: weight loss, heat intolerance, both resolved. TSI antibodies were not elevated.  He was started on methimazole and he was on a high dose for a year, up to 20 mg 3 times a day, after which he stopped the medication at the end of 2019.  He mentions that he felt better on it.  The 01/820, TFTs were improved so we did not restart methimazole but advised him to return for recheck labs in 1.5 months.  He presented 4 months later and TFTs were again thyrotoxic.  We started 5 mg of methimazole daily and he took this for a month, but ran out of prescription afterwards and could not refill it due to apparent problems with the pharmacy.  At our visit in 03/2019, his TFTs were again thyrotoxic so we restarted  methimazole 5 mg daily.  Afterwards, since he was consistent with the dose, his TFTs remain controlled.  In 10/2019, we could decrease the dose to 2.5 mg daily.  Subsequent TFTs were normal in 12/2019 on this dose. -he tolerates methimazole well, without side effects -he continues on 5 mg of methimazole daily -We did discuss about definitive treatment Graves' disease to include RAI treatment ablation or surgery, however, as he is responding well to methimazole after taking it consistently, we decided to continue this -At today's visit, he does not complain of thyrotoxic symptoms -Also, he does not have any signs of Graves' ophthalmopathy: double vision, blurry vision, eye pain, chemosis -We will recheck TFTs today and will adjust the methimazole dose accordingly -I will see him back in 6 months, but likely sooner for labs  Needs refills.  Component     Latest Ref Rng & Units 02/27/2020  TSH     0.35 - 4.50 uIU/mL 1.16  T4,Free(Direct)     0.60 - 1.60 ng/dL 9.37  Triiodothyronine,Free,Serum     2.3 - 4.2 pg/mL 3.6   Thyroid tests are normal.  For now, I would suggest to try to stop methimazole and recheck his test in 5 weeks.  Carlus Pavlov, MD PhD [facility]

## 2020-02-27 NOTE — Patient Instructions (Signed)
Please stop at the lab.  Continue Methimazole 2.5 mg daily.  Please come back for a follow-up appointment in 6 months. 

## 2020-03-19 ENCOUNTER — Telehealth: Payer: Self-pay | Admitting: Internal Medicine

## 2020-03-19 NOTE — Telephone Encounter (Signed)
Called and advised patient results we great and Dr Elvera Lennox states Your thyroid tests are excellent! We can try to stop methimazole and have you back for labs in 5 weeks. Lab appt scheduled for 04/23/20.

## 2020-03-19 NOTE — Telephone Encounter (Signed)
Patient requests to be called at ph# [phone] to be given his lab results

## 2020-04-23 ENCOUNTER — Other Ambulatory Visit (INDEPENDENT_AMBULATORY_CARE_PROVIDER_SITE_OTHER): Payer: Commercial Managed Care - PPO

## 2020-04-23 ENCOUNTER — Other Ambulatory Visit: Payer: Self-pay

## 2020-04-23 DIAGNOSIS — E0500 Thyrotoxicosis with diffuse goiter without thyrotoxic crisis or storm: Secondary | ICD-10-CM

## 2020-04-23 LAB — T3, FREE: T3, Free: 3.3 pg/mL (ref 2.3–4.2)

## 2020-04-23 LAB — TSH: TSH: 0.81 u[IU]/mL (ref 0.35–4.50)

## 2020-04-23 LAB — T4, FREE: Free T4: 1.11 ng/dL (ref 0.60–1.60)

## 2020-07-13 ENCOUNTER — Other Ambulatory Visit: Payer: Self-pay | Admitting: Internal Medicine

## 2020-07-13 DIAGNOSIS — E0500 Thyrotoxicosis with diffuse goiter without thyrotoxic crisis or storm: Secondary | ICD-10-CM

## 2020-07-16 ENCOUNTER — Other Ambulatory Visit (INDEPENDENT_AMBULATORY_CARE_PROVIDER_SITE_OTHER): Payer: Commercial Managed Care - PPO

## 2020-07-16 ENCOUNTER — Other Ambulatory Visit: Payer: Self-pay

## 2020-07-16 DIAGNOSIS — E0500 Thyrotoxicosis with diffuse goiter without thyrotoxic crisis or storm: Secondary | ICD-10-CM

## 2020-07-16 LAB — T4, FREE: Free T4: 1.01 ng/dL (ref 0.60–1.60)

## 2020-07-16 LAB — TSH: TSH: 1.69 u[IU]/mL (ref 0.35–4.50)

## 2020-07-16 LAB — T3, FREE: T3, Free: 3.3 pg/mL (ref 2.3–4.2)

## 2020-09-03 ENCOUNTER — Ambulatory Visit: Payer: Commercial Managed Care - PPO | Admitting: Internal Medicine

## 2020-11-19 ENCOUNTER — Other Ambulatory Visit: Payer: Self-pay

## 2020-11-19 ENCOUNTER — Encounter: Payer: Self-pay | Admitting: Internal Medicine

## 2020-11-19 ENCOUNTER — Ambulatory Visit (INDEPENDENT_AMBULATORY_CARE_PROVIDER_SITE_OTHER): Payer: Commercial Managed Care - PPO | Admitting: Internal Medicine

## 2020-11-19 VITALS — BP 120/80 | HR 71 | Ht 67.0 in | Wt 231.6 lb

## 2020-11-19 DIAGNOSIS — E0500 Thyrotoxicosis with diffuse goiter without thyrotoxic crisis or storm: Secondary | ICD-10-CM | POA: Diagnosis not present

## 2020-11-19 LAB — TSH: TSH: 1.74 u[IU]/mL (ref 0.35–5.50)

## 2020-11-19 LAB — T3, FREE: T3, Free: 3.3 pg/mL (ref 2.3–4.2)

## 2020-11-19 NOTE — Patient Instructions (Signed)
Please stop at the lab.  Continue off Methimazole.  Please come back for a follow-up appointment in 6 months.

## 2020-11-19 NOTE — Progress Notes (Signed)
Patient ID: Tyler Gutierrez, male   DOB: 06-01-1955, 65 y.o.   MRN: [id number]   This visit occurred during the SARS-CoV-2 public health emergency.  Safety protocols were in place, including screening questions prior to the visit, additional usage of staff PPE, and extensive cleaning of exam room while observing appropriate contact time as indicated for disinfecting solutions.   HPI  Tyler Gutierrez is a 65 y.o.-year-old male, initially referred by his PCP, Dr. Purnell Shoemaker, returning for follow-up for Graves disease.  Last visit 9 months ago.  Interim history: No weight loss, palpitations, tremors, heat intolerance. He is sweating more, though. He also had a weight gain of 4 pounds since last visit.  Reviewed and addended history: He was dx'ed with hyperthyroidism in 2016 by screening at work. Retrospectively, at that time, he lost 20 lbs in 3-4 mo; increased heat intolerance and sweating.  12/31/2014: Thyroid uptake and scan: Consistent with Graves' disease: Markedly diffuse increased uptake throughout both lobes of the thyroid gland identified. No dominant hot or cold nodules. The 4 hour radioactive iodine uptake is equal to 89.5%. The 24 hour radioactive iodine uptake is equal to 99.7%.  He saw hs PCP >> started MMI >> lately 20 mg 3x a day x last year.  However, he was not very compliant with his medication.  TFTs remained uncontrolled.  PCP decided to refer him to endocrinology at that time.  He ran out MMI in the first week of 03/2018 >> he was off the medication for approximately 1 month.  He felt that his heat intolerance improved after he stopped methimazole.  At that time, we checked his TFTs and they were much improved, with now normal free thyroid hormones and improved TSH.  His TSI antibodies were not elevated.  Therefore, I advised him to stay off methimazole pending repeat TFTs in 1.5 months.  However, he did not return for labs.  In 07/2018, his TSH was still  low and he is free T4 was elevated.  We started methimazole 5 mg daily in 07/2018 with improvement of his TFTs in 08/2018 but TSH remained undetectable in 10/2018 and I advised him to increase the dose to 7.5 mg daily in 10/2018.  In 11/2018 he told me that he ran out of methimazole in 08/2018 due to lack of refills.  He did not let me know.  However, in 11/2018 his TFTs were better so we did not restart methimazole.  I advised him to come back for labs in 1.5 months, but he did not do so.  In 03/2019, we had to restart methimazole 5 mg daily.  His TFTs were in the thyrotoxic range.  Subsequent TFTs were normal in 05/2019.  We continued the same dose of methimazole.  In 10/2019, TFTs were normal, so we decreased the methimazole dose to 2.5 mg daily.  In 12/2019, TFTs were normal.   In 02/2020, we stopped methimazole  Reviewed his TFTs: Lab Results  Component Value Date   TSH 1.69 07/16/2020   TSH 0.81 04/23/2020   TSH 1.16 02/27/2020   TSH 1.09 12/19/2019   TSH 1.45 10/17/2019   TSH 1.28 08/01/2019   TSH 0.54 06/06/2019   TSH 0.024 (L) 03/28/2019   TSH 0.02 (L) 11/22/2018   TSH <0.01 (L) 11/08/2018   FREET4 1.01 07/16/2020   FREET4 1.11 04/23/2020   FREET4 1.11 02/27/2020   FREET4 1.09 12/19/2019   FREET4 1.17 10/17/2019   FREET4 1.5 08/01/2019   FREET4 1.10 06/06/2019  FREET4 2.32 (H) 03/28/2019   FREET4 1.7 11/22/2018   FREET4 1.44 11/08/2018   T3FREE 3.3 07/16/2020   T3FREE 3.3 04/23/2020   T3FREE 3.6 02/27/2020   T3FREE 3.6 12/19/2019   T3FREE 3.4 10/17/2019   T3FREE 3.7 08/01/2019   T3FREE 3.6 06/06/2019   T3FREE 5.3 (H) 03/28/2019   T3FREE 4.1 11/22/2018   T3FREE 3.8 11/08/2018  11/10/2017: TSH 0.008, free T4 1.96 (0.82-1.77): Free T3 4.33 (2.0-4.4)  His TSI antibodies were not elevated: Lab Results  Component Value Date   TSI 123 04/19/2018   Pt denies: - feeling nodules in neck - hoarseness - dysphagia - choking - SOB with lying down  Pt does not  have a FH of thyroid ds. No FH of thyroid cancer. No h/o radiation tx to head or neck.  No seaweed or kelp. No recent contrast studies. No herbal supplements. No Biotin use. No recent steroids use.   He has no other medical history.  ROS: + See HPI  I reviewed pt's medications, allergies, PMH, social hx, family hx, and changes were documented in the history of present illness. Otherwise, unchanged from my initial visit note.  PMH: - see HPI  Social History   Socioeconomic History   Marital status: Married    Spouse name: Not on file   Number of children: Not on file   Years of education: Not on file   Highest education level: Not on file  Occupational History   Not on file  Tobacco Use   Smoking status: Never   Smokeless tobacco: Never  Vaping Use   Vaping Use: Never used  Substance and Sexual Activity   Alcohol use: Never   Drug use: Never   Sexual activity: Not on file  Other Topics Concern   Not on file  Social History Narrative   Not on file   Social Determinants of Health   Financial Resource Strain: Not on file  Food Insecurity: Not on file  Transportation Needs: Not on file  Physical Activity: Not on file  Stress: Not on file  Social Connections: Not on file  Intimate Partner Violence: Not on file   Current Outpatient Medications on File Prior to Visit  Medication Sig Dispense Refill   HYDROcodone-acetaminophen (NORCO/VICODIN) 5-325 MG tablet Take 1 tablet by mouth every 4 (four) hours as needed. 10 tablet 0   lidocaine (LIDODERM) 5 % Place 1 patch onto the skin daily. Remove & Discard patch within 12 hours or as directed by MD 30 patch 0   methocarbamol (ROBAXIN) 500 MG tablet Take 1 tablet (500 mg total) by mouth 2 (two) times daily. 20 tablet 0   No current facility-administered medications on file prior to visit.   No Known Allergies   No family history on file.  PE: BP 120/80 (BP Location: Right Arm, Patient Position: Sitting, Cuff Size:  Normal)   Pulse 71   Ht 5\' 7"  (1.702 m)   Wt 231 lb 9.6 oz (105.1 kg)   SpO2 97%   BMI 36.27 kg/m  Wt Readings from Last 3 Encounters:  11/19/20 231 lb 9.6 oz (105.1 kg)  02/27/20 227 lb (103 kg)  02/15/20 220 lb (99.8 kg)   Constitutional: overweight, in NAD Eyes: PERRLA, EOMI, no exophthalmos ENT: moist mucous membranes, no thyromegaly, no cervical lymphadenopathy Cardiovascular: RRR, No MRG Respiratory: CTA B Gastrointestinal: abdomen soft, NT, ND, BS+ Musculoskeletal: no deformities, strength intact in all 4 Skin: moist, warm, no rashes Neurological: no tremor with outstretched hands,  DTR normal in all 4  ASSESSMENT: 1. Graves ds. - uncontrolled  PLAN:  1.  Patient with history of Graves' disease, with uncontrolled thyroid test in the past, with initial thyrotoxic symptoms: Weight loss, heat intolerance, both resolved.  TSI antibodies were not elevated.  He was started on methimazole and he was on a high dose MMI, up to 20 mg 3 times a day for a year, after which he stopped the medication in the 2019.  He mentioned that he felt better off it.  Afterwards, we kept him off the methimazole for a period of time but he again developed thyrotoxicosis afterwards.  We started a low-dose of methimazole, 5 mg daily, which she took for a month but ran out of the prescription and could not refill it.  At our visit in 01/20, his TFTs were again thyrotoxic so we restarted the vitamin D daily dose.  TFTs remained controlled afterwards and we could decrease the dose to 2.5 mg daily in 10/2019.  Subsequent TFTs were normal in 12/2019 on this dose.  We then stop methimazole in 02/2020. -Reviewed TFTs from 07/2020 and these were normal.  We continued him off methimazole. -At this visit, he denies thyrotoxic symptoms. He gained 4 lbs since last OV.  He is wondering whether this is coming from the thyroid and I explained that probably not, based on previous labs.  I would expect him to gain weight only  if he became hypothyroid.  He also has increased sweating, which could be related to humidity, but it can also occur in the setting of hyperthyroidism.  We will clarify this after results come back -He does not have any signs of active Graves' optimal: No double vision, eye pain, chemosis -We will recheck his TFTs today and see if we need to restart methimazole -I will see him back in 6 months but possibly sooner for labs -He did not obtain the previous results through MyChart and I strongly advised him to start checking MyChart especially after our visits  Needs refills in case we restart methimazole.  Component     Latest Ref Rng & Units 11/19/2020  TSH     0.35 - 5.50 uIU/mL 1.74  T4,Free(Direct)     0.60 - 1.60 ng/dL 0.26  Triiodothyronine,Free,Serum     2.3 - 4.2 pg/mL 3.3  TFTs are great!  No need to restart methimazole.  Carlus Pavlov, MD PhD [facility] Endocrinology

## 2020-11-22 LAB — T4, FREE: Free T4: 1.11 ng/dL (ref 0.60–1.60)

## 2020-11-23 ENCOUNTER — Encounter: Payer: Self-pay | Admitting: Internal Medicine

## 2021-02-04 IMAGING — DX DG LUMBAR SPINE COMPLETE 4+V
5 series · 5 of 5 positions shown · non-contrast
Comparison: None.

CLINICAL DATA: Back pain.

EXAM:
LUMBAR SPINE - COMPLETE 4+ VIEW

[l-spine ap]
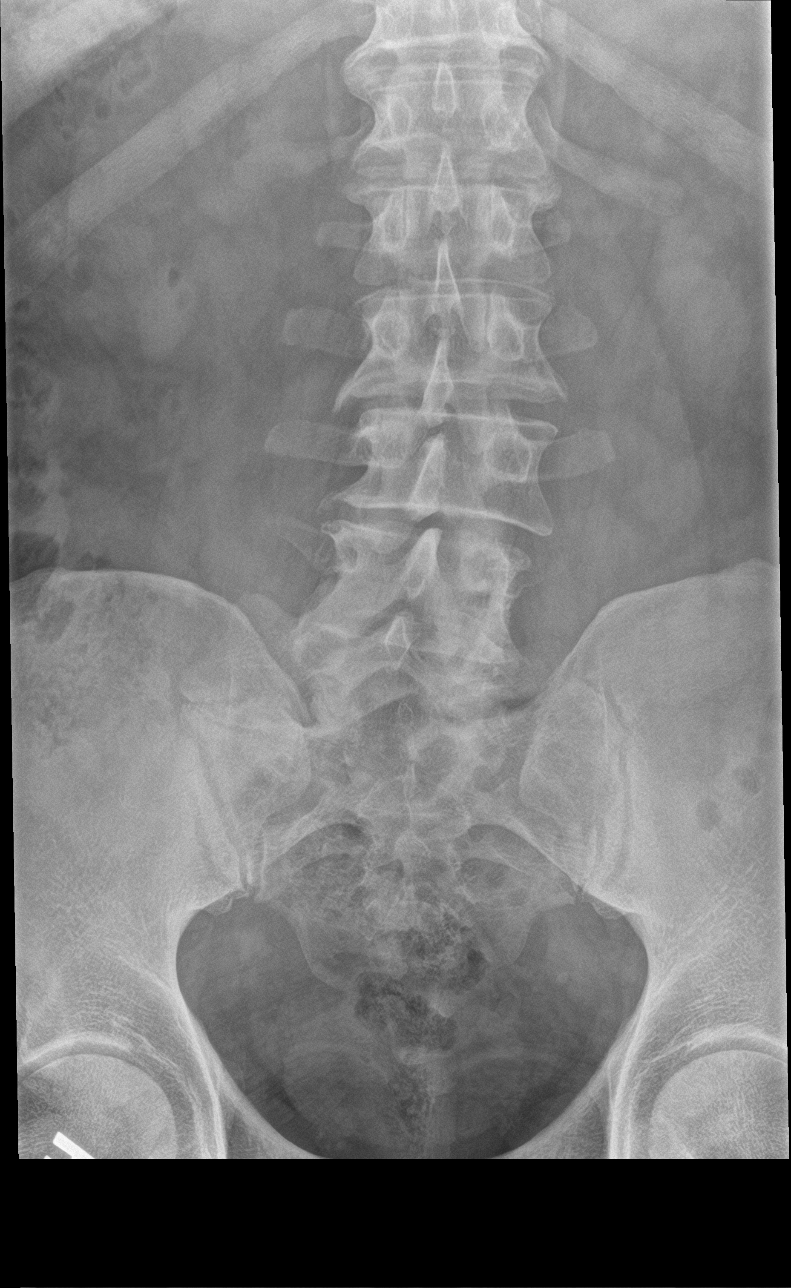

[l-spine obl (1 of 2)]
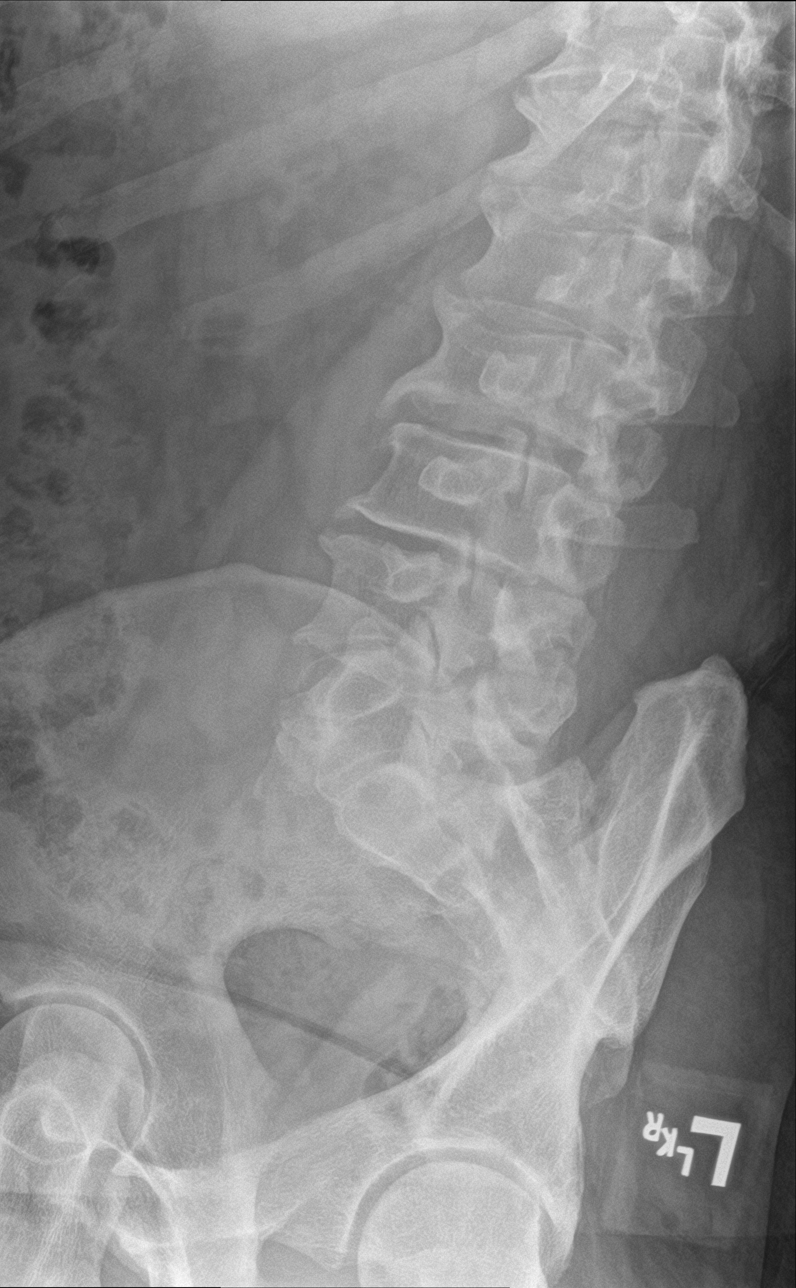

[l-spine obl (2 of 2)]
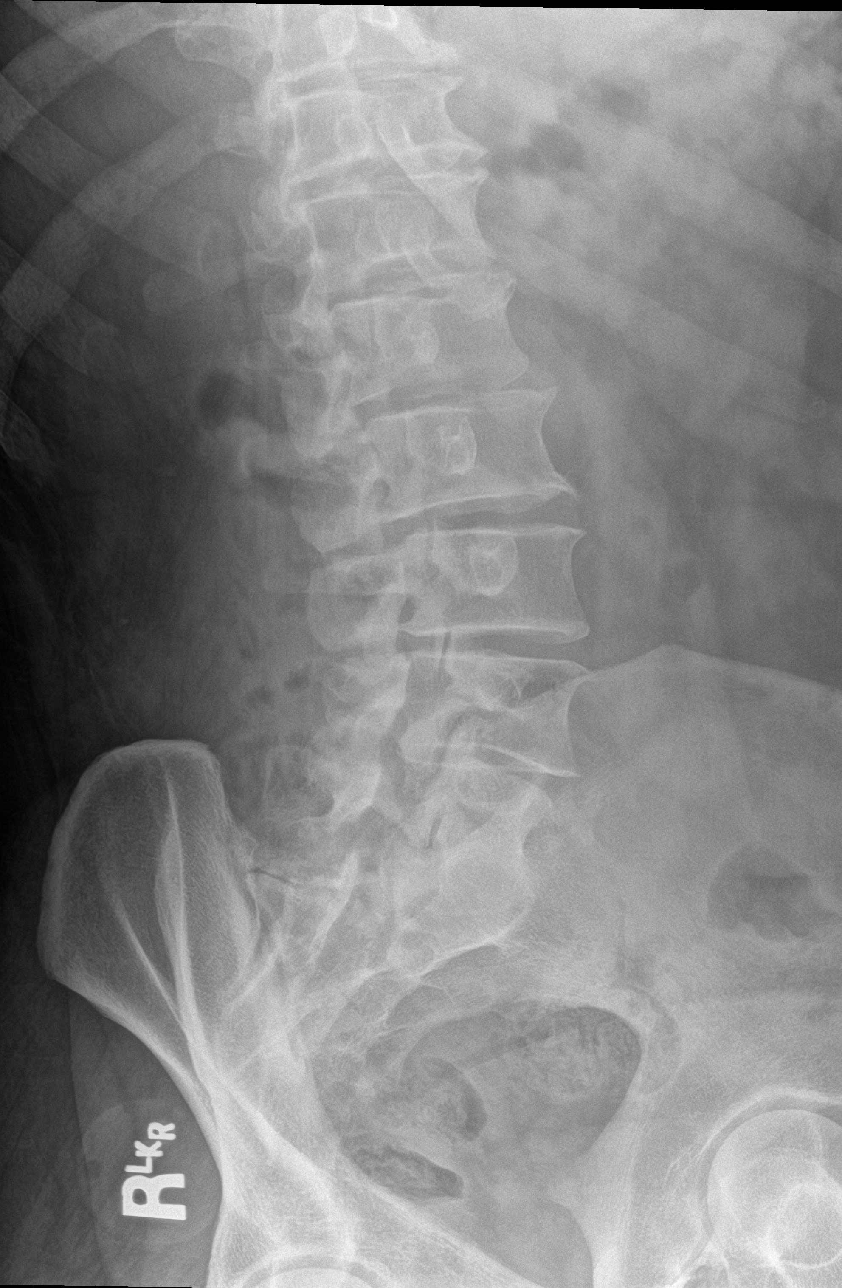

[l-spine lat]
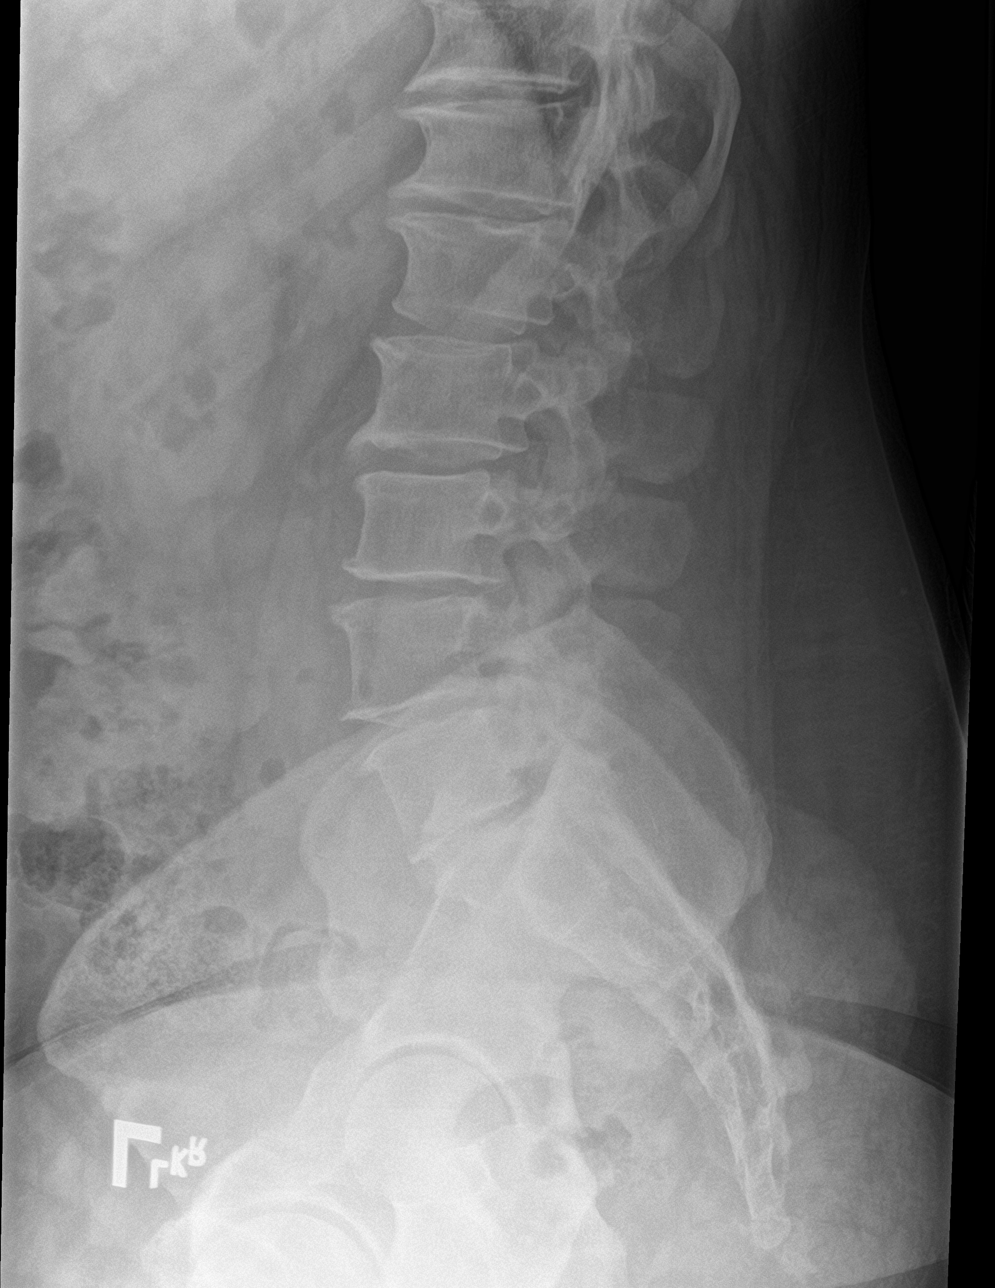

[l-spine spot]
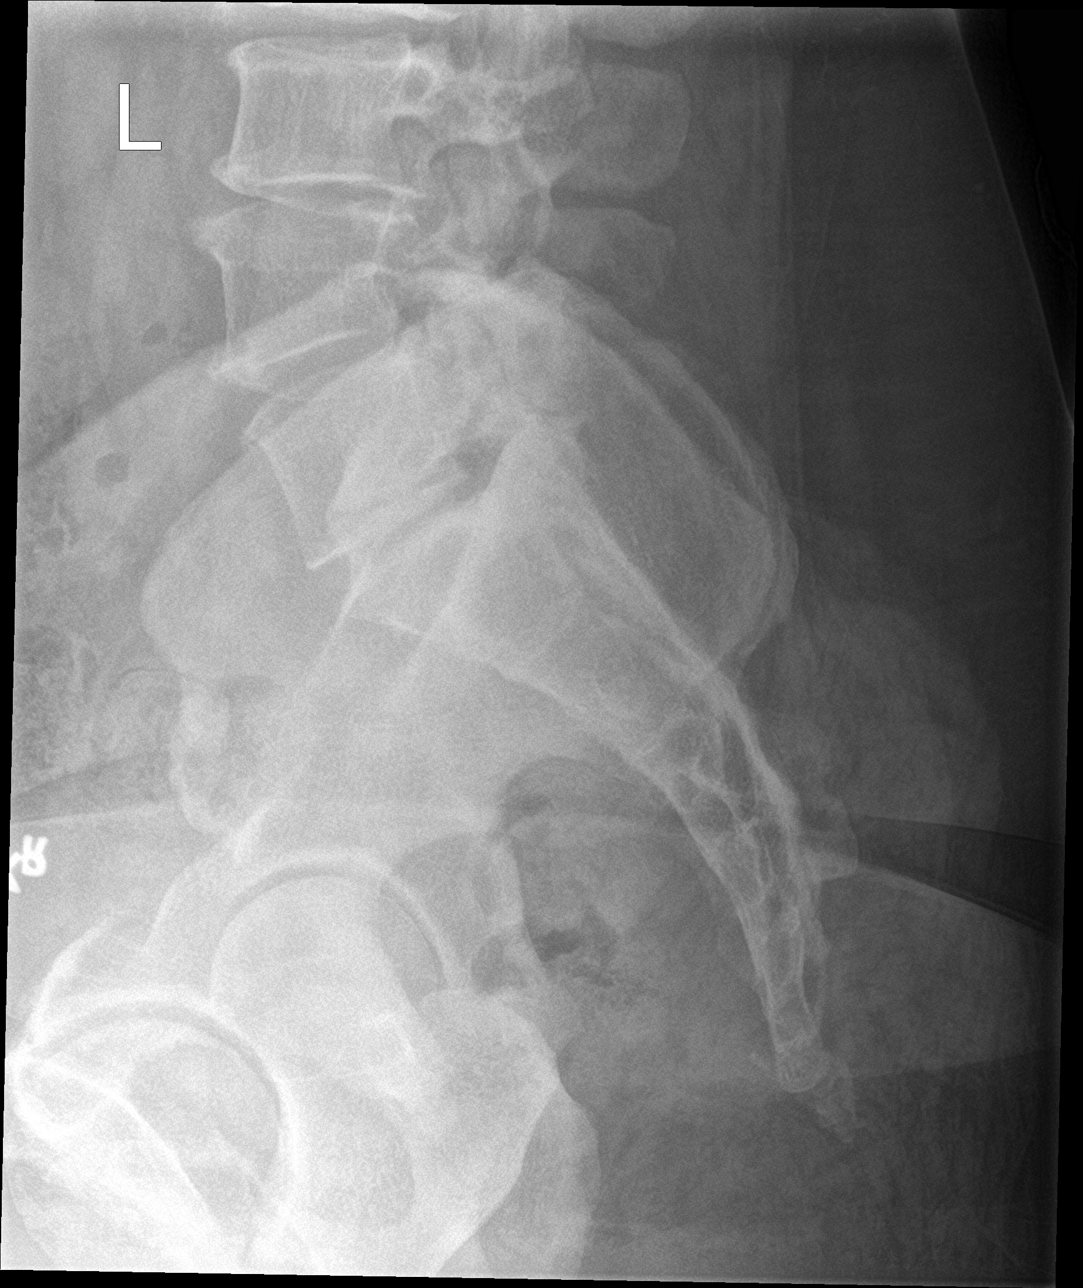

[5 of 5 positions shown; findings below may reference images not displayed]

FINDINGS: No fracture. No subluxation. Mild loss of disc height noted L3-4,
L4-5 common L5-S1 with associated endplate spurring. SI joints and
symphysis pubis unremarkable. The facets are well aligned
bilaterally.
IMPRESSION: Degenerative disc disease lower lumbar spine. No acute bony
abnormality.

## 2021-03-14 ENCOUNTER — Other Ambulatory Visit: Payer: Self-pay

## 2021-03-14 ENCOUNTER — Ambulatory Visit
Admission: EM | Admit: 2021-03-14 | Discharge: 2021-03-14 | Disposition: A | Discharge status: Home / Self Care | Payer: Commercial Managed Care - PPO | Attending: Physician Assistant | Admitting: Physician Assistant

## 2021-03-14 DIAGNOSIS — M62830 Muscle spasm of back: Secondary | ICD-10-CM | POA: Diagnosis not present

## 2021-03-14 DIAGNOSIS — S39012A Strain of muscle, fascia and tendon of lower back, initial encounter: Secondary | ICD-10-CM | POA: Diagnosis not present

## 2021-03-14 DIAGNOSIS — M545 Low back pain, unspecified: Secondary | ICD-10-CM

## 2021-03-14 MED ORDER — BACLOFEN 10 MG PO TABS: 10.0000 mg | ORAL_TABLET | Freq: Three times a day (TID) | ORAL | 0 refills | Status: AC | PRN

## 2021-03-14 NOTE — Discharge Instructions (Addendum)
BACK PAIN: You pulled a muscle. Stressed avoiding painful activities . RICE (REST, ICE, COMPRESSION, ELEVATION) guidelines reviewed. May alternate ice and heat. Consider use of muscle rubs, Salonpas patches, etc. Use medications as directed including muscle relaxers if prescribed. Take anti-inflammatory medications as prescribed or OTC NSAIDs(Aleve or Motrin) /Tylenol.  F/u with PCP in 7-10 days for reexamination, and please feel free to call or return to the urgent care at any time for any questions or concerns you may have and we will be happy to help you!   BACK PAIN RED FLAGS: If the back pain acutely worsens or there are any red flag symptoms such as numbness/tingling, leg weakness, saddle anesthesia, or loss of bowel/bladder control, go immediately to the ER. Follow up with [place] as scheduled or sooner if the pain does not begin to resolve or if it worsens before the follow up

## 2021-03-14 NOTE — ED Provider Notes (Signed)
[facility]    CSN: [id number] Arrival date & time: 03/14/21  1105      History   Chief Complaint Chief Complaint  Patient presents with   Back Pain    HPI Tyler Gutierrez is a 66 y.o. male presenting for 1 week history of left lower back pain.  Patient reports that he was lifting a pipe one-handed on the left side when he felt a lot of pain afterwards.  Reports the following day he tried to go back to work but the pain worsened.  Reports increased pain when bending forward and picking things up.  Patient says he is taken the last several days off and feels a little bit better.  Has been taking Tylenol for pain and using muscle rubs as well as heating pads.  Denies any pain in lower extremities, leg weakness, loss of bowel or bladder control.  Reports similar symptoms when he pulled muscle a few years ago.  No other complaints.  HPI  History reviewed. No pertinent past medical history.  Patient Active Problem List   Diagnosis Date Noted   Graves disease 04/19/2018    History reviewed. No pertinent surgical history.     Home Medications    Prior to Admission medications   Medication Sig Start Date End Date Taking? Authorizing Provider  baclofen (LIORESAL) 10 MG tablet Take 1 tablet (10 mg total) by mouth 3 (three) times daily as needed for up to 7 days for muscle spasms. 03/14/21 03/21/21 Yes Shirlee Latch, PA-C    Family History History reviewed. No pertinent family history.  Social History Social History   Tobacco Use   Smoking status: Never   Smokeless tobacco: Never  Vaping Use   Vaping Use: Never used  Substance Use Topics   Alcohol use: Never   Drug use: Never     Allergies   Patient has no known allergies.   Review of Systems Review of Systems  Genitourinary:  Negative for dysuria and flank pain.  Musculoskeletal:  Positive for back pain. Negative for gait problem.  Skin:  Negative for color change.  Neurological:  Negative  for weakness and numbness.    Physical Exam Triage Vital Signs ED Triage Vitals  Enc Vitals Group     BP 03/14/21 1223 (!) 149/101     Pulse Rate 03/14/21 1223 99     Resp 03/14/21 1223 18     Temp 03/14/21 1223 98.6 F (37 C)     Temp Source 03/14/21 1223 Oral     SpO2 03/14/21 1223 99 %     Weight 03/14/21 1222 230 lb (104.3 kg)     Height 03/14/21 1222 5\' 7"  (1.702 m)     Head Circumference --      Peak Flow --      Pain Score 03/14/21 1222 8     Pain Loc --      Pain Edu? --      Excl. in GC? --    No data found.  Updated Vital Signs BP (!) 149/101 (BP Location: Left Arm)    Pulse 99    Temp 98.6 F (37 C) (Oral)    Resp 18    Ht 5\' 7"  (1.702 m)    Wt 230 lb (104.3 kg)    SpO2 99%    BMI 36.02 kg/m   Physical Exam Vitals and nursing note reviewed.  Constitutional:      General: He is not in acute distress.  Appearance: Normal appearance. He is well-developed. He is not ill-appearing.  HENT:     Head: Normocephalic and atraumatic.  Eyes:     General: No scleral icterus.    Conjunctiva/sclera: Conjunctivae normal.  Cardiovascular:     Rate and Rhythm: Normal rate and regular rhythm.     Heart sounds: Normal heart sounds.  Pulmonary:     Effort: Pulmonary effort is normal. No respiratory distress.     Breath sounds: Normal breath sounds.  Musculoskeletal:     Cervical back: Neck supple.     Lumbar back: Spasms (Left lower back) and tenderness (Left paraspinal muscles) present. No swelling or deformity. Normal range of motion. Negative right straight leg raise test and negative left straight leg raise test.  Skin:    General: Skin is warm and dry.     Capillary Refill: Capillary refill takes less than 2 seconds.  Neurological:     General: No focal deficit present.     Mental Status: He is alert. Mental status is at baseline.     Motor: No weakness.     Coordination: Coordination normal.     Gait: Gait normal.  Psychiatric:        Mood and Affect: Mood  normal.        Behavior: Behavior normal.        Thought Content: Thought content normal.     UC Treatments / Results  Labs (all labs ordered are listed, but only abnormal results are displayed) Labs Reviewed - No data to display  EKG   Radiology No results found.  Procedures Procedures (including critical care time)  Medications Ordered in UC Medications - No data to display  Initial Impression / Assessment and Plan / UC Course  I have reviewed the triage vital signs and the nursing notes.  Pertinent labs & imaging results that were available during my care of the patient were reviewed by me and considered in my medical decision making (see chart for details).  66 year old male presenting for left lower back pain for the past week.  Pain started after he lifted a heavy object with the left hand only.  Reports that he bent all the way forward to pick it up.  Patient did not squat to pick this up.  He reports pain is improved over the past week but he has not been working.  Reports he has been using heating pad, Tylenol and muscle rubs.  Vitals are stable.  Patient overall well-appearing.  On exam he has tenderness palpation of the left paraspinal muscles in the lumbar region as well as muscle spasms of the left lumbar muscles.  Full range of motion of back seemingly without any pain in all directions.  Negative straight leg raise bilaterally.  Good strength and sensation throughout.  Suspect lumbar strain and muscle spasm treating at this time with baclofen.  Also reviewed RICE guidelines and Aleve and Tylenol for pain relief as well as continuing heating pad and muscle rubs.  Reviewed return and ER precautions related to back pain.  Work note given for the next couple days.   Final Clinical Impressions(s) / UC Diagnoses   Final diagnoses:  Strain of lumbar region, initial encounter  Muscle spasm of back  Acute left-sided low back pain without sciatica     Discharge  Instructions      BACK PAIN: You pulled a muscle. Stressed avoiding painful activities . RICE (REST, ICE, COMPRESSION, ELEVATION) guidelines reviewed. May alternate ice and heat. Consider  use of muscle rubs, Salonpas patches, etc. Use medications as directed including muscle relaxers if prescribed. Take anti-inflammatory medications as prescribed or OTC NSAIDs(Aleve or Motrin) /Tylenol.  F/u with PCP in 7-10 days for reexamination, and please feel free to call or return to the urgent care at any time for any questions or concerns you may have and we will be happy to help you!   BACK PAIN RED FLAGS: If the back pain acutely worsens or there are any red flag symptoms such as numbness/tingling, leg weakness, saddle anesthesia, or loss of bowel/bladder control, go immediately to the ER. Follow up with us as scheduled or sooner if the pain does not begin to resolve or if it worsens before the follow up       ED Prescriptions     Medication Sig Dispense Auth. Provider   baclofen (LIORESAL) 10 MG tablet Take 1 tablet (10 mg total) by mouth 3 (three) times daily as needed for up to 7 days for muscle spasms. 20 each Shirlee LatchEaves, Senta Kantor B, PA-C      I have reviewed the PDMP during this encounter.   Shirlee Latchaves, Sadao Weyer B, PA-C 03/14/21 1342

## 2021-03-14 NOTE — ED Triage Notes (Signed)
Pt c/o back pain. Pt was lifting a pipe last week on Monday and felt pain in his back. Pt states that he was working on Tuesday for a long shift and it worsened the pain in his back. Pt states that it is painful to bend over and pick things up.

## 2021-05-20 ENCOUNTER — Ambulatory Visit: Payer: Commercial Managed Care - PPO | Admitting: Internal Medicine

## 2021-05-20 ENCOUNTER — Encounter: Payer: Self-pay | Admitting: Internal Medicine

## 2021-05-20 ENCOUNTER — Other Ambulatory Visit: Payer: Self-pay

## 2021-05-20 VITALS — BP 128/64 | HR 94 | Ht 67.0 in | Wt 237.0 lb

## 2021-05-20 DIAGNOSIS — E0500 Thyrotoxicosis with diffuse goiter without thyrotoxic crisis or storm: Secondary | ICD-10-CM | POA: Diagnosis not present

## 2021-05-20 NOTE — Patient Instructions (Signed)
Please stop at the lab. ? ?Continue off Methimazole. ? ?Please come back for a follow-up appointment in 1 year, but in 6 months for labs. ? ?

## 2021-05-20 NOTE — Progress Notes (Signed)
Patient ID: Tyler Gutierrez, male   DOB: Mar 05, 1956, 66 y.o.   MRN: [id number]   This visit occurred during the SARS-CoV-2 public health emergency.  Safety protocols were in place, including screening questions prior to the visit, additional usage of staff PPE, and extensive cleaning of exam room while observing appropriate contact time as indicated for disinfecting solutions.   HPI  Tyler Gutierrez is a 66 y.o.-year-old male, initially referred by his PCP, Dr. Purnell Shoemaker, returning for follow-up for Graves disease.  Last visit 6 months ago.  Interim history: No weight loss, palpitations, tremors, heat intolerance.  He had increased sweating at last OV >> resolved.  More recently, he had 2 episodes of night sweats 3 weeks ago.  None since then. He gained 6 pounds since last visit.  2 months ago he was in the emergency room with lumbar strain.  He currently has some muscle cramps in his intercostal muscles bilaterally.  Reviewed and addended history: He was dx'ed with hyperthyroidism in 2016 by screening at work. Retrospectively, at that time, he lost 20 lbs in 3-4 mo; increased heat intolerance and sweating.  12/31/2014: Thyroid uptake and scan: Consistent with Graves' disease: Markedly diffuse increased uptake throughout both lobes of the thyroid gland identified. No dominant hot or cold nodules. The 4 hour radioactive iodine uptake is equal to 89.5%. The 24 hour radioactive iodine uptake is equal to 99.7%.  He saw hs PCP >> started MMI >> lately 20 mg 3x a day x last year.  However, he was not very compliant with his medication.  TFTs remained uncontrolled.  PCP decided to refer him to endocrinology at that time.  He ran out MMI in the first week of 03/2018 >> he was off the medication for approximately 1 month.  He felt that his heat intolerance improved after he stopped methimazole.  At that time, we checked his TFTs and they were much improved, with now normal free thyroid  hormones and improved TSH.  His TSI antibodies were not elevated.  Therefore, I advised him to stay off methimazole pending repeat TFTs in 1.5 months.  However, he did not return for labs.  In 07/2018, his TSH was still low and he is free T4 was elevated.  We started methimazole 5 mg daily in 07/2018 with improvement of his TFTs in 08/2018 but TSH remained undetectable in 10/2018 and I advised him to increase the dose to 7.5 mg daily in 10/2018.  In 11/2018 he told me that he ran out of methimazole in 08/2018 due to lack of refills.  He did not let me know.  However, in 11/2018 his TFTs were better so we did not restart methimazole.  I advised him to come back for labs in 1.5 months, but he did not do so.  In 03/2019, we had to restart methimazole 5 mg daily.  His TFTs were in the thyrotoxic range.  Subsequent TFTs were normal in 05/2019.  We continued the same dose of methimazole.  In 10/2019, TFTs were normal, so we decreased the methimazole dose to 2.5 mg daily.  In 12/2019, TFTs were normal.   In 02/2020, we stopped methimazole  Reviewed his TFTs: Lab Results  Component Value Date   TSH 1.74 11/19/2020   TSH 1.69 07/16/2020   TSH 0.81 04/23/2020   TSH 1.16 02/27/2020   TSH 1.09 12/19/2019   TSH 1.45 10/17/2019   TSH 1.28 08/01/2019   TSH 0.54 06/06/2019   TSH 0.024 (L) 03/28/2019  TSH 0.02 (L) 11/22/2018   FREET4 1.11 11/19/2020   FREET4 1.01 07/16/2020   FREET4 1.11 04/23/2020   FREET4 1.11 02/27/2020   FREET4 1.09 12/19/2019   FREET4 1.17 10/17/2019   FREET4 1.5 08/01/2019   FREET4 1.10 06/06/2019   FREET4 2.32 (H) 03/28/2019   FREET4 1.7 11/22/2018   T3FREE 3.3 11/19/2020   T3FREE 3.3 07/16/2020   T3FREE 3.3 04/23/2020   T3FREE 3.6 02/27/2020   T3FREE 3.6 12/19/2019   T3FREE 3.4 10/17/2019   T3FREE 3.7 08/01/2019   T3FREE 3.6 06/06/2019   T3FREE 5.3 (H) 03/28/2019   T3FREE 4.1 11/22/2018  11/10/2017: TSH 0.008, free T4 1.96 (0.82-1.77): Free T3 4.33  (2.0-4.4)  His TSI antibodies were not elevated: Lab Results  Component Value Date   TSI 123 04/19/2018   Pt denies: - feeling nodules in neck - hoarseness - dysphagia - choking - SOB with lying down  Pt does not have a FH of thyroid ds. No FH of thyroid cancer. No h/o radiation tx to head or neck.  No recent contrast studies. No herbal supplements. No Biotin use. No recent steroids use.   He has no other medical history.  ROS: + See HPI  I reviewed pt's medications, allergies, PMH, social hx, family hx, and changes were documented in the history of present illness. Otherwise, unchanged from my initial visit note.  PMH: - see HPI  Social History   Socioeconomic History   Marital status: Married    Spouse name: Not on file   Number of children: Not on file   Years of education: Not on file   Highest education level: Not on file  Occupational History   Not on file  Tobacco Use   Smoking status: Never   Smokeless tobacco: Never  Vaping Use   Vaping Use: Never used  Substance and Sexual Activity   Alcohol use: Never   Drug use: Never   Sexual activity: Not on file  Other Topics Concern   Not on file  Social History Narrative   Not on file   Social Determinants of Health   Financial Resource Strain: Not on file  Food Insecurity: Not on file  Transportation Needs: Not on file  Physical Activity: Not on file  Stress: Not on file  Social Connections: Not on file  Intimate Partner Violence: Not on file   No current outpatient medications on file prior to visit.   No current facility-administered medications on file prior to visit.   No Known Allergies   No family history on file.  PE: BP 128/64    Pulse 94    Ht 5\' 7"  (1.702 m)    Wt 237 lb (107.5 kg)    SpO2 96%    BMI 37.12 kg/m  Wt Readings from Last 3 Encounters:  05/20/21 237 lb (107.5 kg)  03/14/21 230 lb (104.3 kg)  11/19/20 231 lb 9.6 oz (105.1 kg)   Constitutional: overweight, in  NAD Eyes: PERRLA, EOMI, no exophthalmos ENT: moist mucous membranes, no thyromegaly, no cervical lymphadenopathy Cardiovascular: RRR, No MRG Respiratory: CTA B Musculoskeletal: no deformities, strength intact in all 4 Skin: moist, warm, no rashes Neurological: no tremor with outstretched hands, DTR normal in all 4  ASSESSMENT: 1. Graves ds.  PLAN:  1.  Patient with history of Graves' disease, with uncontrolled thyroid tests in the past, with initial thyrotoxic symptoms: Weight loss, heat intolerance, both resolved.  TSI antibodies were not elevated.  He was initially started on high-dose  methimazole, 20 mg 3 times a day for a year, after which she stopped the medication in 2019.  He mentions that he felt better off it.  Afterwards, we kept him off methimazole for a period of time but he again developed thyrotoxicosis and we started a low-dose of methimazole, 5 mg daily.  He took a month of this but ran out of the prescription and could not refill it.  At our visit from 03/2018, his TFTs were again thyrotoxic so we restarted the methimazole.  TFTs remained controlled afterwards and we could decrease the dose to 2.5 mg daily in 10/2019 and then stop the methimazole completely in 02/2020. -At last 2 checks in 07/2020 in 11/2020, TFTs remained normal.  We did not restart methimazole. -At today's visit, he denies thyrotoxic symptoms.  He gained 6 pounds since last visit.  He denies palpitations, tremors, and any more heat intolerance except for 2 episodes of night sweats 3 weeks ago.  Also, his pulse was high at the beginning of the visit today, at 94.  However, at the time of my physical exam, heart rate was in the 80s. -He denies any signs of active Graves' after myopathy: No double vision, eye pain, chemosis -At today's visit we will check his TFTs and see if we need to restart methimazole -I will have him come back in a year but in 6 months for labs  Needs refills in case we restart  methimazole.  Component     Latest Ref Rng & Units 05/20/2021  TSH     0.450 - 4.500 uIU/mL 0.008 (L)  T4,Free(Direct)     0.82 - 1.77 ng/dL 5.36 (H)  Triiodothyronine,Free,Serum     2.0 - 4.4 pg/mL 7.0 (H)  Unfortunately, the tests are abnormal again.  I would suggest to start methimazole 5 mg twice a day and have him return for labs in 4 weeks.  Carlus Pavlov, MD PhD [facility]

## 2021-05-21 LAB — TSH: TSH: 0.008 u[IU]/mL — ABNORMAL LOW (ref 0.450–4.500)

## 2021-05-21 LAB — T3, FREE: T3, Free: 7 pg/mL — ABNORMAL HIGH (ref 2.0–4.4)

## 2021-05-21 LAB — T4, FREE: Free T4: 2.63 ng/dL — ABNORMAL HIGH (ref 0.82–1.77)

## 2021-05-23 MED ORDER — METHIMAZOLE 5 MG PO TABS: 5.0000 mg | ORAL_TABLET | Freq: Two times a day (BID) | ORAL | 3 refills | Status: DC

## 2021-07-15 ENCOUNTER — Encounter: Payer: Self-pay | Admitting: Internal Medicine

## 2021-07-15 ENCOUNTER — Ambulatory Visit: Payer: Commercial Managed Care - PPO | Admitting: Internal Medicine

## 2021-07-15 VITALS — BP 120/80 | HR 75 | Ht 67.0 in | Wt 241.2 lb

## 2021-07-15 DIAGNOSIS — E0500 Thyrotoxicosis with diffuse goiter without thyrotoxic crisis or storm: Secondary | ICD-10-CM

## 2021-07-15 LAB — T3, FREE: T3, Free: 3.5 pg/mL (ref 2.3–4.2)

## 2021-07-15 LAB — T4, FREE: Free T4: 1.26 ng/dL (ref 0.60–1.60)

## 2021-07-15 LAB — TSH: TSH: 0.02 u[IU]/mL — ABNORMAL LOW (ref 0.35–5.50)

## 2021-07-15 NOTE — Patient Instructions (Addendum)
For now, please continue methimazole 5 mg 2x a day. ? ?Please stop at the lab. ? ?Please call to schedule the thyroid uptake (CPT [phone]) [place]: [phone]. ? ?5 days before the thyroid uptake stop Methimazole and resume it right after the test. ? ?Then call him to schedule the RAI treatment (CPT [phone]) [place]: [phone] ? ?5 days before the RAI treatment, stop Methimazole and DO NOT resume it. ? ?4 weeks after the treatment, please come back for labs - Call [phone] to schedule a lab appt. ? ? Please return to see me in 3 months. ? ?

## 2021-07-15 NOTE — Progress Notes (Signed)
Patient ID: Tyler Gutierrez, male   DOB: 08/19/1955, 66 y.o.   MRN: [id number]  ? ?This visit occurred during the SARS-CoV-2 public health emergency.  Safety protocols were in place, including screening questions prior to the visit, additional usage of staff PPE, and extensive cleaning of exam room while observing appropriate contact time as indicated for disinfecting solutions.  ? ?HPI  ?Tyler Gutierrez is a 66 y.o.-year-old male, initially referred by his PCP, Dr. Purnell Shoemaker, returning for follow-up for Graves disease.  Last visit 2 months ago. ? ?Interim history: ?No weight loss, palpitations, tremors, heat intolerance.  Before last visit, he had 2 episodes of night sweats.  No recent episodes. ?We started methimazole at last visit.  He continues on this. ? ?Reviewed and addended history: ?He was dx'ed with hyperthyroidism in 2016 by screening at work. Retrospectively, at that time, he lost 20 lbs in 3-4 mo; increased heat intolerance and sweating. ? ?12/31/2014: Thyroid uptake and scan: Consistent with Graves' disease: ?Markedly diffuse increased uptake throughout both lobes of the thyroid gland identified. No dominant hot or cold nodules. ?The 4 hour radioactive iodine uptake is equal to 89.5%. The 24 hour radioactive iodine uptake is equal to 99.7%. ? ?He saw hs PCP >> started MMI >> lately 20 mg 3x a day x last year.  However, he was not very compliant with his medication.  TFTs remained uncontrolled.  PCP decided to refer him to endocrinology at that time. ? ?He ran out MMI in the first week of 03/2018 >> he was off the medication for approximately 1 month.  He felt that his heat intolerance improved after he stopped methimazole.  ?At that time, we checked his TFTs and they were much improved, with now normal free thyroid hormones and improved TSH.  His TSI antibodies were not elevated.  Therefore, I advised him to stay off methimazole pending repeat TFTs in 1.5 months.  However, he did not  return for labs. ? ?In 07/2018, his TSH was still low and he is free T4 was elevated.  We started methimazole 5 mg daily in 07/2018 with improvement of his TFTs in 08/2018 but TSH remained undetectable in 10/2018 and I advised him to increase the dose to 7.5 mg daily in 10/2018. ? ?In 11/2018 he told me that he ran out of methimazole in 08/2018 due to lack of refills.  He did not let me know.  However, in 11/2018 his TFTs were better so we did not restart methimazole.  I advised him to come back for labs in 1.5 months, but he did not do so. ? ?In 03/2019, we had to restart methimazole 5 mg daily.  His TFTs were in the thyrotoxic range.  Subsequent TFTs were normal in 05/2019.  We continued the same dose of methimazole. ? ?In 10/2019, TFTs were normal, so we decreased the methimazole dose to 2.5 mg daily. ? ?In 12/2019, TFTs were normal.  ? ?In 02/2020, we stopped methimazole. ? ?In 05/2021, we restarted methimazole 5 mg 2x a day. ? ?Reviewed his TFTs: ?Lab Results  ?Component Value Date  ? TSH 0.008 (L) 05/20/2021  ? TSH 1.74 11/19/2020  ? TSH 1.69 07/16/2020  ? TSH 0.81 04/23/2020  ? TSH 1.16 02/27/2020  ? TSH 1.09 12/19/2019  ? TSH 1.45 10/17/2019  ? TSH 1.28 08/01/2019  ? TSH 0.54 06/06/2019  ? TSH 0.024 (L) 03/28/2019  ? FREET4 2.63 (H) 05/20/2021  ? FREET4 1.11 11/19/2020  ? FREET4 1.01 07/16/2020  ?  FREET4 1.11 04/23/2020  ? FREET4 1.11 02/27/2020  ? FREET4 1.09 12/19/2019  ? FREET4 1.17 10/17/2019  ? FREET4 1.5 08/01/2019  ? FREET4 1.10 06/06/2019  ? FREET4 2.32 (H) 03/28/2019  ? T3FREE 7.0 (H) 05/20/2021  ? T3FREE 3.3 11/19/2020  ? T3FREE 3.3 07/16/2020  ? T3FREE 3.3 04/23/2020  ? T3FREE 3.6 02/27/2020  ? T3FREE 3.6 12/19/2019  ? T3FREE 3.4 10/17/2019  ? T3FREE 3.7 08/01/2019  ? T3FREE 3.6 06/06/2019  ? T3FREE 5.3 (H) 03/28/2019  ?11/10/2017: TSH 0.008, free T4 1.96 (0.82-1.77): Free T3 4.33 (2.0-4.4) ? ?His TSI antibodies were not elevated: ?Lab Results  ?Component Value Date  ? TSI 123 04/19/2018  ? ?Pt  denies: ?- feeling nodules in neck ?- hoarseness ?- dysphagia ?- choking ?- SOB with lying down ? ?Pt does not have a FH of thyroid ds. No FH of thyroid cancer. No h/o radiation tx to head or neck. ? ?No recent contrast studies. No herbal supplements. No Biotin use. No recent steroids use.  ? ?He has no other medical history. ? ?ROS: ?+ See HPI ? ?I reviewed pt's medications, allergies, PMH, social hx, family hx, and changes were documented in the history of present illness. Otherwise, unchanged from my initial visit note. ? ?PMH: ?- see HPI ? ?Social History  ? ?Socioeconomic History  ? Marital status: Married  ?  Spouse name: Not on file  ? Number of children: Not on file  ? Years of education: Not on file  ? Highest education level: Not on file  ?Occupational History  ? Not on file  ?Tobacco Use  ? Smoking status: Never  ? Smokeless tobacco: Never  ?Vaping Use  ? Vaping Use: Never used  ?Substance and Sexual Activity  ? Alcohol use: Never  ? Drug use: Never  ? Sexual activity: Not on file  ?Other Topics Concern  ? Not on file  ?Social History Narrative  ? Not on file  ? ?Social Determinants of Health  ? ?Financial Resource Strain: Not on file  ?Food Insecurity: Not on file  ?Transportation Needs: Not on file  ?Physical Activity: Not on file  ?Stress: Not on file  ?Social Connections: Not on file  ?Intimate Partner Violence: Not on file  ? ?Current Outpatient Medications on File Prior to Visit  ?Medication Sig Dispense Refill  ? methimazole (TAPAZOLE) 5 MG tablet Take 1 tablet (5 mg total) by mouth 2 (two) times daily. 120 tablet 3  ? ?No current facility-administered medications on file prior to visit.  ? ?No Known Allergies  ? ?No family history on file. ? ?PE: ?BP 120/80 (BP Location: Left Arm, Patient Position: Sitting, Cuff Size: Normal)   Pulse 75   Ht 5\' 7"  (1.702 m)   Wt 241 lb 3.2 oz (109.4 kg)   SpO2 98%   BMI 37.78 kg/m?  ?Wt Readings from Last 3 Encounters:  ?07/15/21 241 lb 3.2 oz (109.4 kg)   ?05/20/21 237 lb (107.5 kg)  ?03/14/21 230 lb (104.3 kg)  ? ?Constitutional: overweight, in NAD ?Eyes: PERRLA, EOMI, no exophthalmos ?ENT: moist mucous membranes, no thyromegaly, no cervical lymphadenopathy ?Cardiovascular: RRR, No MRG ?Respiratory: CTA B ?Musculoskeletal: no deformities, strength intact in all 4 ?Skin: moist, warm, no rashes ?Neurological: no tremor with outstretched hands, DTR normal in all 4 ? ?ASSESSMENT: ?1. Graves ds. ? ?PLAN:  ?1.  ?Patient with history of Graves' disease, with  initial thyrotoxic symptoms: Weight loss, heat intolerance, both resolved..  TSI antibodies were not elevated.  He was initially started on a high dose of methimazole, 20 mg 3 times a day for a year after which she stopped the medication in 2019 and mentioned that he felt better off of it.  We kept him off methimazole for a period of time but he again developed thyrotoxicosis and we had to start the thionamide again, at 5 mg daily.  He took this for a month but then ran out and could not refill it.  At our visit from 03/2018, TFTs were again thyrotoxic so we restarted the methimazole.  His thyroid tests remain controlled afterwards so we could decrease the dose of methimazole and then stop completely in 02/2020. ?-He had normal TFTs in 07/2020 and 11/2020, however, at last visit,In 05/2021, TFTs were in the thyrotoxic range again.  This will be the third recurrence. ?-At last visit, he did complain about 2 episodes of night sweats and he had a high pulse, at 94, when he arrived to the clinic.  Otherwise, he denied palpitations, tremors, heat intolerance or recurrent night sweats.  At this visit, he remains asymptomatic.  Pulses normal. ?-We started methimazole 5 mg twice a day.  He continues on this dose. ?-At today's visit, we will recheck his TFTs and change the methimazole dose accordingly ?-However, we also discussed about definitive treatment for his Graves' disease with RAI ablation.  We discussed what this  entails and the fact that he most likely would be hypothyroid afterwards and would need to be on levothyroxine.  However, this is a much more manageable condition and much better tolerated.  This visit, he agrees wi

## 2021-07-18 MED ORDER — METHIMAZOLE 5 MG PO TABS: 5.0000 mg | ORAL_TABLET | Freq: Every day | ORAL | 3 refills | Status: DC

## 2021-08-09 ENCOUNTER — Encounter (HOSPITAL_COMMUNITY)
Admission: RE | Admit: 2021-08-09 | Discharge: 2021-08-09 | Disposition: A | Discharge status: Home / Self Care | Payer: Commercial Managed Care - PPO | Source: Ambulatory Visit | Attending: Internal Medicine | Admitting: Internal Medicine

## 2021-08-09 DIAGNOSIS — E0500 Thyrotoxicosis with diffuse goiter without thyrotoxic crisis or storm: Secondary | ICD-10-CM | POA: Diagnosis not present

## 2021-08-09 MED ORDER — SODIUM IODIDE I-123 7.4 MBQ CAPS
151.3000 | ORAL_CAPSULE | Freq: Once | ORAL | Status: AC
  Administered 2021-08-09: 151.3 via ORAL

## 2021-08-10 ENCOUNTER — Encounter (HOSPITAL_COMMUNITY)
Admission: RE | Admit: 2021-08-10 | Discharge: 2021-08-10 | Disposition: A | Discharge status: Home / Self Care | Payer: Commercial Managed Care - PPO | Source: Ambulatory Visit | Attending: Internal Medicine | Admitting: Internal Medicine

## 2021-08-11 NOTE — Addendum Note (Signed)
Addended by: Carlus Pavlov on: 08/11/2021 12:23 PM   Modules accepted: Orders

## 2021-08-15 MED ORDER — METHIMAZOLE 5 MG PO TABS: 5.0000 mg | ORAL_TABLET | Freq: Every day | ORAL | 3 refills | Status: DC

## 2021-08-15 NOTE — Addendum Note (Signed)
Addended by: Kenyon Ana on: 08/15/2021 04:39 PM   Modules accepted: Orders

## 2021-08-18 NOTE — Written Directive (Addendum)
MOLECULAR IMAGING AND THERAPEUTICS WRITTEN DIRECTIVE   PATIENT NAME: Tyler Gutierrez  PT DOB:   Aug 06, 1955                                              MRN: [id number]  ---------------------------------------------------------------------------------------------------------------------   I-131 WHOLE THYROID THERAPY (NON-CANCER)    RADIOPHARMACEUTICAL:   Iodine-131 Capsule    PRESCRIBED DOSE FOR ADMINISTRATION: 15 mCi   ROUTE OFADMINISTRATION: PO   DIAGNOSIS:  Graves Disease   REFERRING PHYSICIAN: Gherghe   TSH:    Lab Results  Component Value Date   TSH 0.02 Repeated and verified X2. (L) 07/15/2021   TSH 0.008 (L) 05/20/2021   TSH 1.74 11/19/2020     PRIOR I-131 THERAPY (Date and Dose): No   PRIOR RADIOLOGY EXAMS (Results and Date):  08/09/21 Uptake Only,  4 hour radio iodine uptake is 37.1%. 24 hour radio iodine uptake is 57.7%.  01/05/15 Upt and Scan - Graves disease     ADDITIONAL PHYSICIAN COMMENTS/NOTES: Long standing Graves.  Several trials of methemazole with control.  Looking for more durable control.   AUTHORIZED USER SIGNATURE & TIME STAMP: Rennis Golden, MD   08/18/21    2:34 PM

## 2021-09-02 ENCOUNTER — Encounter (HOSPITAL_COMMUNITY)
Admission: RE | Admit: 2021-09-02 | Discharge: 2021-09-02 | Disposition: A | Discharge status: Home / Self Care | Payer: Commercial Managed Care - PPO | Source: Ambulatory Visit | Attending: Internal Medicine | Admitting: Internal Medicine

## 2021-09-02 DIAGNOSIS — E0500 Thyrotoxicosis with diffuse goiter without thyrotoxic crisis or storm: Secondary | ICD-10-CM | POA: Insufficient documentation

## 2021-09-02 MED ORDER — SODIUM IODIDE I 131 CAPSULE: 15.5000 | Freq: Once | INTRAVENOUS | Status: DC | PRN

## 2021-09-05 ENCOUNTER — Ambulatory Visit (HOSPITAL_COMMUNITY): Payer: Commercial Managed Care - PPO

## 2021-10-13 NOTE — Progress Notes (Signed)
Patient ID: Tyler Gutierrez, male   DOB: 22-Feb-1956, 66 y.o.   MRN: [id number]   HPI  Tyler Gutierrez is a 66 y.o.-year-old male, initially referred by his PCP, Dr. Rene Paci, returning for follow-up for Graves disease.  Last visit 3 months ago.  Interim history: No palpitations, tremors, heat intolerance.  Some hoarseness. Resolved night sweats. He now returns after having had radioactive iodine treatment for his hyperthyroidism 1.5 months ago.  Reviewed and addended history: He was dx'ed with hyperthyroidism in 2016 by screening at work. Retrospectively, at that time, he lost 20 lbs in 3-4 mo; increased heat intolerance and sweating.  12/31/2014: Thyroid uptake and scan: Consistent with Graves' disease: Markedly diffuse increased uptake throughout both lobes of the thyroid gland identified. No dominant hot or cold nodules. The 4 hour radioactive iodine uptake is equal to 89.5%. The 24 hour radioactive iodine uptake is equal to 99.7%.  He saw hs PCP >> started MMI >> lately 20 mg 3x a day x last year.  However, he was not very compliant with his medication.  TFTs remained uncontrolled.  PCP decided to refer him to endocrinology at that time.  He ran out [place] in the first week of 03/2018 >> he was off the medication for approximately 1 month.  He felt that his heat intolerance improved after he stopped methimazole.  At that time, we checked his TFTs and they were much improved, with now normal free thyroid hormones and improved TSH.  His TSI antibodies were not elevated.  Therefore, I advised him to stay off methimazole pending repeat TFTs in 1.5 months.  However, he did not return for labs.  In 07/2018, his TSH was still low and he is free T4 was elevated.  We started methimazole 5 mg daily in 07/2018 with improvement of his TFTs in 08/2018 but TSH remained undetectable in 10/2018 and I advised him to increase the dose to 7.5 mg daily in 10/2018.  In 11/2018 he told me that  he ran out of methimazole in 08/2018 due to lack of refills.  He did not let me know.  However, in 11/2018 his TFTs were better so we did not restart methimazole.  I advised him to come back for labs in 1.5 months, but he did not do so.  In 03/2019, we had to restart methimazole 5 mg daily.  His TFTs were in the thyrotoxic range.  Subsequent TFTs were normal in 05/2019.  We continued the same dose of methimazole.  In 10/2019, TFTs were normal, so we decreased the methimazole dose to 2.5 mg daily.  In 12/2019, TFTs were normal.   In 02/2020, we stopped methimazole.  In 05/2021, we restarted methimazole 5 mg 2x a day.  08/10/2021: RAI uptake and scan: At 4 hours, 37.1%, at 24 hours, 57.7% uptake; uniform scan, pyramidal lobe evident  09/02/2021: RAI treatment  Reviewed his TFTs: Lab Results  Component Value Date   TSH 0.02 Repeated and verified X2. (L) 07/15/2021   TSH 0.008 (L) 05/20/2021   TSH 1.74 11/19/2020   TSH 1.69 07/16/2020   TSH 0.81 04/23/2020   TSH 1.16 02/27/2020   TSH 1.09 12/19/2019   TSH 1.45 10/17/2019   TSH 1.28 08/01/2019   TSH 0.54 06/06/2019   FREET4 1.26 07/15/2021   FREET4 2.63 (H) 05/20/2021   FREET4 1.11 11/19/2020   FREET4 1.01 07/16/2020   FREET4 1.11 04/23/2020   FREET4 1.11 02/27/2020   FREET4 1.09 12/19/2019   FREET4 1.17 10/17/2019  FREET4 1.5 08/01/2019   FREET4 1.10 06/06/2019   T3FREE 3.5 07/15/2021   T3FREE 7.0 (H) 05/20/2021   T3FREE 3.3 11/19/2020   T3FREE 3.3 07/16/2020   T3FREE 3.3 04/23/2020   T3FREE 3.6 02/27/2020   T3FREE 3.6 12/19/2019   T3FREE 3.4 10/17/2019   T3FREE 3.7 08/01/2019   T3FREE 3.6 06/06/2019  11/10/2017: TSH 0.008, free T4 1.96 (0.82-1.77): Free T3 4.33 (2.0-4.4)  His TSI antibodies were not elevated: Lab Results  Component Value Date   TSI 123 04/19/2018   Pt denies: - feeling nodules in neck - hoarseness - dysphagia - choking  Pt does not have a FH of thyroid ds. No FH of thyroid cancer. No h/o  radiation tx to head or neck. No Biotin use. No recent steroids use.   He has no other medical history.  ROS: + See HPI  I reviewed pt's medications, allergies, PMH, social hx, family hx, and changes were documented in the history of present illness. Otherwise, unchanged from my initial visit note.  PMH: - see HPI  Social History   Socioeconomic History   Marital status: Married    Spouse name: Not on file   Number of children: Not on file   Years of education: Not on file   Highest education level: Not on file  Occupational History   Not on file  Tobacco Use   Smoking status: Never   Smokeless tobacco: Never  Vaping Use   Vaping Use: Never used  Substance and Sexual Activity   Alcohol use: Never   Drug use: Never   Sexual activity: Not on file  Other Topics Concern   Not on file  Social History Narrative   Not on file   Social Determinants of Health   Financial Resource Strain: Not on file  Food Insecurity: Not on file  Transportation Needs: Not on file  Physical Activity: Not on file  Stress: Not on file  Social Connections: Not on file  Intimate Partner Violence: Not on file   Current Outpatient Medications on File Prior to Visit  Medication Sig Dispense Refill   Take 1 tablet (5 mg total) by mouth daily. 90 tablet 3   No current facility-administered medications on file prior to visit.   No Known Allergies   No family history on file.  PE: BP 128/88 (BP Location: Right Arm, Patient Position: Sitting, Cuff Size: Normal)   Pulse 61   Ht 5\' 7"  (1.702 m)   Wt 228 lb 6.4 oz (103.6 kg)   SpO2 98%   BMI 35.77 kg/m  Wt Readings from Last 3 Encounters:  10/14/21 228 lb 6.4 oz (103.6 kg)  07/15/21 241 lb 3.2 oz (109.4 kg)  05/20/21 237 lb (107.5 kg)   Constitutional: overweight, in NAD Eyes: no exophthalmos ENT: moist mucous membranes, no masses palpated in neck, no cervical lymphadenopathy Cardiovascular: RRR, No MRG Respiratory: CTA  B Musculoskeletal: no deformities Skin: moist, warm, no rashes Neurological: no tremor with outstretched hands  ASSESSMENT: 1. Graves ds. - s/p RAI tx  PLAN:  1.  Patient with history of Graves' disease, with initial thyrotoxic symptoms: Weight loss, heat intolerance, both resolved.  TSI antibodies were not elevated.  He was initially started on a high dose of methimazole, 20 mg 3 times a day, for a year, after which he stopped the medication in 2019 and mentions that he felt better off of it.  We kept him off methimazole for a period of time but he again developed  thyrotoxicosis and we had to start it again, at 5 mg daily.  He took this for a month but then ran out and could not refill it.  We had to restart it in 2020.  His TFTs normalized but at our visit in 05/2021, they were thyrotoxic again.  This was his 3rd recurrence.  Therefore, at last visit, I recommended RAI treatment. -Since last visit, he had a thyroid uptake and scan (08/10/2021), which showed uptake at both 4 hours and 24 hours time points.  The scan was uniform, and the pyramidal lobe was evident, confirming Graves' disease -He had RAI treatment on 09/02/2021 -This is his first visit after treatment.  He feels good, without complaints at this visit. -Also, no signs of active Graves' adenopathy: Double vision, eye pain, chemosis -At today's visit, we will recheck his TFTs -We discussed about the possible need to start levothyroxine after today's check, or possible later in the next few months. -Discussed about how to take levothyroxine correctly in case we need to start: every day, with water, at least 30 minutes before breakfast, separated by at least 4 hours from: - acid reflux medications - calcium - iron - multivitamins -I explained that this is a medication that he will most likely need to be taken for life -Discussed about follow-up while on this medication, with labs every approximately 5 to 6 weeks  -I explained that  even if it does become hypothyroid, this is a much more benign condition and then his previous intermittent hyperthyroidism. -I will have him return in 4 to 5 weeks for labs, but in 4 months for another appointment  - Total time spent for the visit: 25 minutes, in obtaining medical information from the chart and from the patient (through the Spanish interpreter), reviewing his  previous labs, imaging evaluations, and treatments, reviewing his symptoms, counseling him about his condition (please see the discussed topics above), and developing a plan to further investigate and treat it; he had a list of questions which I addressed.  Component     Latest Ref Rng 10/14/2021  TSH     0.35 - 5.50 uIU/mL 2.18   T4,Free(Direct)     0.60 - 1.60 ng/dL 9.67   Triiodothyronine,Free,Serum     2.3 - 4.2 pg/mL 2.0 (L)    Patient's TSH is normal, but the free thyroid hormones are low or low normal.  At this point, I would suggest to start levothyroxine 50 mcg daily and recheck the tests in 4 to 5 weeks.  Carlus Pavlov, MD PhD [facility] Endocrinology

## 2021-10-14 ENCOUNTER — Ambulatory Visit (INDEPENDENT_AMBULATORY_CARE_PROVIDER_SITE_OTHER): Payer: Commercial Managed Care - PPO | Admitting: Internal Medicine

## 2021-10-14 ENCOUNTER — Encounter: Payer: Self-pay | Admitting: Internal Medicine

## 2021-10-14 VITALS — BP 128/88 | HR 61 | Ht 67.0 in | Wt 228.4 lb

## 2021-10-14 DIAGNOSIS — E0500 Thyrotoxicosis with diffuse goiter without thyrotoxic crisis or storm: Secondary | ICD-10-CM | POA: Diagnosis not present

## 2021-10-14 DIAGNOSIS — E890 Postprocedural hypothyroidism: Secondary | ICD-10-CM | POA: Diagnosis not present

## 2021-10-14 LAB — T4, FREE: Free T4: 0.61 ng/dL (ref 0.60–1.60)

## 2021-10-14 LAB — TSH: TSH: 2.18 u[IU]/mL (ref 0.35–5.50)

## 2021-10-14 LAB — T3, FREE: T3, Free: 2 pg/mL — ABNORMAL LOW (ref 2.3–4.2)

## 2021-10-14 MED ORDER — LEVOTHYROXINE SODIUM 50 MCG PO TABS: 50.0000 ug | ORAL_TABLET | Freq: Every day | ORAL | 5 refills | Status: DC

## 2021-10-14 NOTE — Patient Instructions (Addendum)
Please stop at the lab.  If we need to start levothyroxine, please take this: every day, with water, at least 30 minutes before breakfast, separated by at least 4 hours from: - acid reflux medications - calcium - iron - multivitamins  Please come back for another visit in 3 months, but likely sooner for labs.  Levothyroxine Tablets Qu es este medicamento? La LEVOTIROXINA trata los niveles bajos de tiroides (hipotiroidismo) en el cuerpo. Acta reemplazando una hormona tiroidea que normalmente produce el cuerpo. Las hormonas tiroideas cumplen una funcin importante en la salud general. Tyler Gutierrez Expose a apoyar el metabolismo y los niveles de [place]. Este medicamento puede ser utilizado para otros usos; si tiene alguna pregunta consulte con su proveedor de atencin mdica o con su farmacutico. MARCAS COMUNES: Estre, Euthyrox, Levo-T, Levothroid, Levoxyl, Synthroid, Thyro-Tabs, Unithroid [employer] a mi profesional de la salud antes de tomar este medicamento? Necesitan saber si usted presenta alguno de los siguientes problemas o situaciones: enfermedad de [place] u otro problema de las glndulas suprarrenales Angina de pecho Problemas seos Hace dieta o sigue un programa para bajar de peso Problemas de fertilidad Enfermedad cardiaca Nivel elevado de azcar en la sangre (diabetes) Problemas de la glndula pituitaria Si [place] medicamentos que tratan o previenen cogulos sanguneos Una reaccin alrgica o inusual a la levotiroxina, a las hormonas tiroideas, a otros medicamentos, alimentos, colorantes o conservantes Si est embarazada o buscando quedar embarazada Si est amamantando a un beb Cmo debo Visual merchandiser medicamento? Tome este medicamento por va oral con abundante agua. Es mejor tomarlo con el estmago vaco, al menos 30 minutos a una hora antes de Engineer, maintenance. Evite tomar anticidos que contengan aluminio o magnesio, simeticona, secuestrantes de cidos biliares, carbonato de  calcio, sulfonato sdico de poliestireno, sulfato de hierro, sevelamer, lantano o sucralfato en un plazo de 4 horas despus de usar [employer]. Siga las instrucciones de la etiqueta del [place]. Tmelo a la Manufacturing systems engineer. No use su medicamento con una frecuencia mayor a la indicada. Hable con su equipo de atencin sobre el uso de este medicamento en nios. Aunque este medicamento se puede recetar a nios y Investment banker, corporate tan pequeos como de unos [address] de edad con ciertas afecciones, existen precauciones que deben tomarse. Para los lactantes, puede triturar la tableta y colocarla en una pequea cantidad (5 a 10 ml o 1 a 2 cucharaditas) de agua, leche materna, o frmula infantil que no sea a base de soya. No la mezcle con frmula infantil a base de soya. Administre segn las instrucciones. Sobredosis: Pngase en contacto inmediatamente con un centro toxicolgico o una sala de urgencia si usted cree que haya tomado demasiado medicamento. ATENCIN: [employer] es solo para usted. No comparta este medicamento con nadie. Qu sucede si me olvido de una dosis? Si olvida una dosis, adminstrela lo antes posible. Si es casi la hora de la prxima dosis, administre solo esa dosis. No se administre dosis adicionales o dobles. Qu puede interactuar con este medicamento? Amiodarona Anticidos Medicamentos para tratar el hipertiroidismo Suplementos de calcio Carbamazepina Ciertos medicamentos para la depresin Ciertos medicamentos para tratar Magazine features editor Colesevelam Colestipol Digoxina Hormonas femeninas, tales como estrgenos y pldoras, parches, anillos o inyecciones anticonceptivos Suplementos de hierro Medical sales representative Productos lquidos para nutricin, como Ensure Litio Medicamentos para resfros y dificultades respiratorias Medicamentos para la diabetes Medicamentos o suplementos dietticos para Publishing copy de  peso Metadona Niacina Orlistat Oxandrolona Fenobarbital u otros barbitricos Fenitona Rifampicina Sevelmero Simeticona Sulfonato sdico de  poliestireno Isoflavonas de soya Medicamentos esteroideos, tales como la prednisona o la cortisona Sucralfato Testosterona Teofilina Warfarina Puede ser que esta lista no menciona todas las posibles interacciones. Informe a su profesional de [employer] de [employer] productos a base de hierbas, medicamentos de [place] o suplementos nutritivos que est tomando. Si usted fuma, consume bebidas alcohlicas o si utiliza drogas ilegales, indqueselo tambin a su profesional de [employer]. Algunas sustancias pueden interactuar con su medicamento. A qu debo estar atento al usar [employer]? Asegrese de tomar este medicamento con abundante cantidad de lquidos. Algunas tabletas pueden provocar ahogamiento, arcadas o dificultad para tragar debido a que la tableta queda atascada en la garganta. La mayora de estos problemas desaparecen si el medicamento se toma con la cantidad [place] de agua u otros lquidos. No cambie de marca de [place] a menos que su equipo de atencin est de acuerdo con el cambio. Pregunte si tiene dudas. Necesitar realizarse exmenes peridicos y [employer] de sangre ocasionales para verificar la respuesta al tratamiento. Si est recibiendo [employer] para una tiroides hipoactiva, es posible que pasen varias semanas antes de notar [place]. Si los sntomas no mejoran, consulte con su equipo de atencin. Es posible que usted deba usar este medicamento por el resto de su vida. No deje de usar [employer] a menos que su equipo de Teacher, English as a foreign language indique [place]. Este medicamento puede [employer] niveles de Banker. Si tiene diabetes, revise su nivel de azcar en la sangre segn le hayan indicado. Podra perder algo de cabello cuando comienza el tratamiento. Con el tiempo, esta situacin por lo general se  corrige sola. Si le Zenaida Niece a Actor, informe a su equipo de atencin que est [employer]. Qu efectos secundarios puedo tener al [employer] este medicamento? Efectos secundarios que debe informar a su equipo de atencin tan pronto como sea posible: Reacciones alrgicas: erupcin cutnea, comezn/picazn, urticaria, hinchazn de la cara, los labios, la lengua o la garganta Ansiedad, nerviosismo Sudoracin excesiva o sensibilidad al calor [place] Palpitaciones cardacas: frecuencia cardiaca rpida, intensa o irregular Cambios en el ritmo cardiaco: frecuencia cardiaca rpida o irregular, mareos, sensacin de desmayo o aturdimiento, Journalist, newspaper, dificultad para respirar Ciclos menstruales irregulares o sangrado ligero entre periodos menstruales Diarrea grave Temblores o sacudidas Dificultad para dormir Efectos secundarios que generalmente no requieren Psychologist, prison and probation services (debe informarlos a su equipo de atencin si persisten o si son molestos): Cambios en el apetito Cada del cabello Dolor de cabeza Nuseas Vmito Puede ser que esta lista no menciona todos los posibles efectos secundarios. Comunquese a su mdico por asesoramiento mdico [employer]. Usted puede informar los efectos secundarios a la FDA por telfono al 1-800-FDA-1088. Dnde debo guardar mi medicina? Mantenga fuera del alcance de nios y Neurosurgeon. Guarde a [employer], entre 15 y 30 grados Celsius (59 y 91 grados Fahrenheit). Proteja de la luz y la humedad. Mantenga el recipiente bien cerrado. Deseche todo el medicamento que no haya utilizado despus de la fecha de vencimiento. ATENCIN: Este folleto es un resumen. Puede ser que no cubra toda la posible informacin. Si usted tiene preguntas acerca de esta medicina, consulte con su mdico, su farmacutico o su profesional de Radiographer, therapeutic.  2023 Elsevier/Gold Standard (2020-09-29 00:00:00)

## 2021-11-25 ENCOUNTER — Other Ambulatory Visit (INDEPENDENT_AMBULATORY_CARE_PROVIDER_SITE_OTHER): Payer: Commercial Managed Care - PPO

## 2021-11-25 ENCOUNTER — Other Ambulatory Visit: Payer: Commercial Managed Care - PPO

## 2021-11-25 DIAGNOSIS — E890 Postprocedural hypothyroidism: Secondary | ICD-10-CM | POA: Diagnosis not present

## 2021-11-25 NOTE — Addendum Note (Signed)
Addended by: Larene Pickett D on: 11/25/2021 03:08 PM   Modules accepted: Orders

## 2021-11-26 LAB — TSH: TSH: 63.5 u[IU]/mL — ABNORMAL HIGH (ref 0.450–4.500)

## 2021-11-26 LAB — T4, FREE: Free T4: 0.4 ng/dL — ABNORMAL LOW (ref 0.82–1.77)

## 2021-11-28 MED ORDER — LEVOTHYROXINE SODIUM 100 MCG PO TABS: 100.0000 ug | ORAL_TABLET | Freq: Every day | ORAL | 3 refills | Status: DC

## 2022-01-20 ENCOUNTER — Encounter: Payer: Self-pay | Admitting: Internal Medicine

## 2022-01-20 ENCOUNTER — Ambulatory Visit (INDEPENDENT_AMBULATORY_CARE_PROVIDER_SITE_OTHER): Payer: Commercial Managed Care - PPO | Admitting: Internal Medicine

## 2022-01-20 VITALS — BP 110/68 | HR 64 | Ht 67.0 in | Wt 235.8 lb

## 2022-01-20 DIAGNOSIS — Z8639 Personal history of other endocrine, nutritional and metabolic disease: Secondary | ICD-10-CM

## 2022-01-20 DIAGNOSIS — E0500 Thyrotoxicosis with diffuse goiter without thyrotoxic crisis or storm: Secondary | ICD-10-CM

## 2022-01-20 DIAGNOSIS — E890 Postprocedural hypothyroidism: Secondary | ICD-10-CM | POA: Insufficient documentation

## 2022-01-20 LAB — T4, FREE: Free T4: 0.76 ng/dL (ref 0.60–1.60)

## 2022-01-20 LAB — TSH: TSH: 43.72 u[IU]/mL — ABNORMAL HIGH (ref 0.35–5.50)

## 2022-01-20 MED ORDER — LEVOTHYROXINE SODIUM 137 MCG PO TABS: 137.0000 ug | ORAL_TABLET | Freq: Every day | ORAL | 3 refills | Status: DC

## 2022-01-20 NOTE — Patient Instructions (Signed)
Please stop at the lab.  Please continue levothyroxine 100 mcg daily, every day, with water, at least 30 minutes before breakfast, separated by at least 4 hours from: - acid reflux medications - calcium - iron - multivitamins  Please come back for another visit in 6 months, but likely sooner for labs.

## 2022-01-20 NOTE — Progress Notes (Signed)
Patient ID: Tyler Gutierrez, male   DOB: 1955/07/02, 66 y.o.   MRN: [id number]   HPI  Tyler Gutierrez is a 66 y.o.-year-old male, initially referred by his PCP, Dr. Purnell Shoemaker, returning for follow-up for Graves disease.  Last visit 3 months ago.  Interim history: No palpitations, tremors, heat intolerance.  No increased fatigue, significant weight gain or cold intolerance. No hoarseness anymore.  Reviewed and addended history: He was dx'ed with hyperthyroidism in 2016 by screening at work. Retrospectively, at that time, he lost 20 lbs in 3-4 mo; increased heat intolerance and sweating.  12/31/2014: Thyroid uptake and scan: Consistent with Graves' disease: Markedly diffuse increased uptake throughout both lobes of the thyroid gland identified. No dominant hot or cold nodules. The 4 hour radioactive iodine uptake is equal to 89.5%. The 24 hour radioactive iodine uptake is equal to 99.7%.  He saw hs PCP >> started methimazole >> lately 20 mg 3x a day x last year.  However, he was not very compliant with his medication.  TFTs remained uncontrolled.  PCP decided to refer him to endocrinology at that time.  He ran out MMI in 03/2018 >> he was off the medication for approximately 1 month.  He felt that his heat intolerance improved after he stopped methimazole. At that time, we checked his TFTs and they were much improved, with now normal free thyroid hormones and improved TSH.  His TSI antibodies were not elevated.  Therefore, I advised him to stay off methimazole pending repeat TFTs in 1.5 months.  However, he did not return for labs.  In 07/2018, his TSH was still low and he is free T4 was elevated.  We started methimazole 5 mg daily in 07/2018 with improvement of his TFTs in 08/2018 but TSH remained undetectable in 10/2018 and I advised him to increase the dose to 7.5 mg daily in 10/2018.  In 11/2018 he told me that he ran out of methimazole in 08/2018 due to lack of refills.  He  did not let me know.  However, in 11/2018 his TFTs were better so we did not restart methimazole.  I advised him to come back for labs in 1.5 months, but he did not do so.  In 03/2019, we had to restart methimazole 5 mg daily.  His TFTs were in the thyrotoxic range.  Subsequent TFTs were normal in 05/2019.  We continued the same dose of methimazole.  In 10/2019, TFTs were normal, so we decreased the methimazole dose to 2.5 mg daily.  In 12/2019, TFTs were normal.   In 02/2020, we stopped methimazole.  In 05/2021, we restarted methimazole 5 mg 2x a day.  08/10/2021: RAI uptake and scan: At 4 hours, 37.1%, at 24 hours, 57.7% uptake; uniform scan, pyramidal lobe evident  09/02/2021: RAI treatment    In 10/2021, we started LT4 50 mcg daily.   In 11/2021, we increased LT4 to 100 mcg daily.  Pt takes LT4: - daily - in am (3:30-4 am) - fasting - at least 30 min from b'fast - no calcium - no iron - + multivitamins >4h later - no PPIs - not on Biotin  Reviewed his TFTs: Lab Results  Component Value Date   TSH 63.500 (H) 11/25/2021   TSH 2.18 10/14/2021   TSH 0.02 Repeated and verified X2. (L) 07/15/2021   TSH 0.008 (L) 05/20/2021   TSH 1.74 11/19/2020   TSH 1.69 07/16/2020   TSH 0.81 04/23/2020   TSH 1.16 02/27/2020   TSH 1.09  12/19/2019   TSH 1.45 10/17/2019   FREET4 0.40 (L) 11/25/2021   FREET4 0.61 10/14/2021   FREET4 1.26 07/15/2021   FREET4 2.63 (H) 05/20/2021   FREET4 1.11 11/19/2020   FREET4 1.01 07/16/2020   FREET4 1.11 04/23/2020   FREET4 1.11 02/27/2020   FREET4 1.09 12/19/2019   FREET4 1.17 10/17/2019   T3FREE 2.0 (L) 10/14/2021   T3FREE 3.5 07/15/2021   T3FREE 7.0 (H) 05/20/2021   T3FREE 3.3 11/19/2020   T3FREE 3.3 07/16/2020   T3FREE 3.3 04/23/2020   T3FREE 3.6 02/27/2020   T3FREE 3.6 12/19/2019   T3FREE 3.4 10/17/2019   T3FREE 3.7 08/01/2019  11/10/2017: TSH 0.008, free T4 1.96 (0.82-1.77): Free T3 4.33 (2.0-4.4)  His TSI antibodies were not  elevated: Lab Results  Component Value Date   TSI 123 04/19/2018   Pt denies: - feeling nodules in neck - hoarseness - dysphagia - choking  Pt does not have a FH of thyroid ds. No FH of thyroid cancer. No h/o radiation tx to head or neck. No Biotin use. No recent steroids use.   He has no other medical history.  ROS: + See HPI  I reviewed pt's medications, allergies, PMH, social hx, family hx, and changes were documented in the history of present illness. Otherwise, unchanged from my initial visit note.  PMH: - see HPI  Social History   Socioeconomic History   Marital status: Married    Spouse name: Not on file   Number of children: Not on file   Years of education: Not on file   Highest education level: Not on file  Occupational History   Not on file  Tobacco Use   Smoking status: Never   Smokeless tobacco: Never  Vaping Use   Vaping Use: Never used  Substance and Sexual Activity   Alcohol use: Never   Drug use: Never   Sexual activity: Not on file  Other Topics Concern   Not on file  Social History Narrative   Not on file   Social Determinants of Health   Financial Resource Strain: Not on file  Food Insecurity: Not on file  Transportation Needs: Not on file  Physical Activity: Not on file  Stress: Not on file  Social Connections: Not on file  Intimate Partner Violence: Not on file   Current Outpatient Medications  Medication Sig Dispense Refill   levothyroxine (SYNTHROID) 100 MCG tablet Take 1 tablet (100 mcg total) by mouth daily. 60 tablet 3   No current facility-administered medications for this visit.   No Known Allergies   No family history on file.  PE: BP 110/68 (BP Location: Right Arm, Patient Position: Sitting, Cuff Size: Normal)   Pulse 64   Ht 5\' 7"  (1.702 m)   Wt 235 lb 12.8 oz (107 kg)   SpO2 97%   BMI 36.93 kg/m  Wt Readings from Last 3 Encounters:  01/20/22 235 lb 12.8 oz (107 kg)  10/14/21 228 lb 6.4 oz (103.6 kg)   07/15/21 241 lb 3.2 oz (109.4 kg)   Constitutional: overweight, in NAD Eyes: no exophthalmos ENT: no masses palpated in neck, no cervical lymphadenopathy Cardiovascular: RRR, No MRG Respiratory: CTA B Musculoskeletal: no deformities Skin: moist, warm, no rashes Neurological: no tremor with outstretched hands  ASSESSMENT: 1. H/o Graves ds. - s/p RAI tx  2. Postablative hypothyroidism  PLAN:  1. And 2. Patient with history of Graves' disease, with initial thyrotoxic symptoms: Weight loss, heat intolerance, both resolved.  TSI antibodies were  not elevated.  He was initially started on a high dose of methimazole, 20 mg 3 times a day, for a year, after which he stopped the medication in 2019 and mentions that he felt better off of it.  We kept him off methimazole for a period of time but he again developed thyrotoxicosis and we had to start it again, at 5 mg daily.  He took this for a month but then ran out and could not refill it.  We had to restart it in 2020.  His TFTs normalized but at our visit in 05/2021, they were thyrotoxic again.  This was his 3rd recurrence.  I therefore recommended RAI treatment. -We checked a thyroid uptake and scan (08/10/2021), which showed uptake at both 4 hours and 24 hours time points.  The scan was uniform, and the pyramidal lobe was evident, confirming Graves' disease. -He had RAI treatment in 08/2021 -I saw him after the treatment and he was feeling well, without complaints. -TFTs were normal at last visit with the exception of a slightly low free T3.  Anticipating a more significant decrease, I advised him to start levothyroxine 50 mcg daily. -We rechecked his TFTs in 11/2021 and a TSH was much higher at that time: Lab Results  Component Value Date   TSH 63.500 (H) 11/25/2021  - we increased  LT4 to 100 mcg daily -he is taking this dose now - pt did not feel a difference when his TSH was high and he also feels good on this LT4 dose. He gained 7 lbs since  last OV (heavy clothes).  He does not have the heat intolerance anymore. - no signs of active Graves ophthalmopathy: no blurry vision, erythema, chemosis, double vision - we discussed about taking the thyroid hormone every day, with water, >30 minutes before breakfast, separated by >4 hours from acid reflux medications, calcium, iron, multivitamins. Pt. is taking it correctly. - will check thyroid tests today: TSH and fT4 - If labs are abnormal, he will need to return for repeat TFTs in 1.5 months -I will see him back in 6 months, but with labs sooner  Needs refills for 3 months.  Component     Latest Ref Rng 01/20/2022  TSH     0.35 - 5.50 uIU/mL 43.72 (H)   T4,Free(Direct)     0.60 - 1.60 ng/dL 5.62     Will increase his levothyroxine dose to 137 mcg daily and repeat his TFTs in 1.5 months.  Carlus Pavlov, MD PhD [facility] Endocrinology

## 2022-03-03 ENCOUNTER — Other Ambulatory Visit (INDEPENDENT_AMBULATORY_CARE_PROVIDER_SITE_OTHER): Payer: Commercial Managed Care - PPO

## 2022-03-03 DIAGNOSIS — E890 Postprocedural hypothyroidism: Secondary | ICD-10-CM

## 2022-03-03 NOTE — Addendum Note (Signed)
Addended by: STONE, Kyal Arts I on: 03/03/2022 10:38 AM   Modules accepted: Orders  

## 2022-03-04 LAB — T4, FREE: Free T4: 1 ng/dL (ref 0.8–1.8)

## 2022-03-04 LAB — TSH: TSH: 35.55 mIU/L — ABNORMAL HIGH (ref 0.40–4.50)

## 2022-03-08 ENCOUNTER — Other Ambulatory Visit: Payer: Self-pay | Admitting: Internal Medicine

## 2022-03-08 DIAGNOSIS — E890 Postprocedural hypothyroidism: Secondary | ICD-10-CM

## 2022-03-08 MED ORDER — LEVOTHYROXINE SODIUM 150 MCG PO TABS: 150.0000 ug | ORAL_TABLET | Freq: Every day | ORAL | 3 refills | Status: DC

## 2022-05-19 ENCOUNTER — Other Ambulatory Visit (INDEPENDENT_AMBULATORY_CARE_PROVIDER_SITE_OTHER): Payer: Commercial Managed Care - PPO

## 2022-05-19 DIAGNOSIS — E890 Postprocedural hypothyroidism: Secondary | ICD-10-CM | POA: Diagnosis not present

## 2022-05-19 LAB — T4, FREE: Free T4: 0.88 ng/dL (ref 0.60–1.60)

## 2022-05-19 LAB — TSH: TSH: 19.69 u[IU]/mL — ABNORMAL HIGH (ref 0.35–5.50)

## 2022-05-19 MED ORDER — LEVOTHYROXINE SODIUM 175 MCG PO TABS: 175.0000 ug | ORAL_TABLET | Freq: Every day | ORAL | 2 refills | Status: DC

## 2022-05-26 ENCOUNTER — Ambulatory Visit: Payer: Commercial Managed Care - PPO | Admitting: Internal Medicine

## 2022-07-21 ENCOUNTER — Encounter: Payer: Self-pay | Admitting: Internal Medicine

## 2022-07-21 ENCOUNTER — Ambulatory Visit (INDEPENDENT_AMBULATORY_CARE_PROVIDER_SITE_OTHER): Payer: Commercial Managed Care - PPO | Admitting: Internal Medicine

## 2022-07-21 VITALS — BP 122/70 | HR 84 | Resp 16 | Ht 67.0 in | Wt 239.6 lb

## 2022-07-21 DIAGNOSIS — Z8639 Personal history of other endocrine, nutritional and metabolic disease: Secondary | ICD-10-CM | POA: Diagnosis not present

## 2022-07-21 DIAGNOSIS — E890 Postprocedural hypothyroidism: Secondary | ICD-10-CM | POA: Diagnosis not present

## 2022-07-21 NOTE — Progress Notes (Unsigned)
Patient ID: Tyler Gutierrez, male   DOB: 1955-09-04, 67 y.o.   MRN: [id number]   HPI  Tyler Gutierrez is a 67 y.o.-year-old male, initially referred by his PCP, Dr. Purnell Shoemaker, returning for follow-up for Graves disease.  Last visit 6 months ago.  Interim history: No palpitations, tremors.  No significant weight gain or cold intolerance. However, he describes sweating, irritability, sleepiness after meals, mm cramps.  Reviewed and addended history: He was dx'ed with hyperthyroidism in 2016 by screening at work. Retrospectively, at that time, he lost 20 lbs in 3-4 mo; increased heat intolerance and sweating.  12/31/2014: Thyroid uptake and scan: Consistent with Graves' disease: Markedly diffuse increased uptake throughout both lobes of the thyroid gland identified. No dominant hot or cold nodules. The 4 hour radioactive iodine uptake is equal to 89.5%. The 24 hour radioactive iodine uptake is equal to 99.7%.  He saw hs PCP >> started methimazole >> 20 mg 3x a day.  However, he was not very compliant with his medication.  TFTs remained uncontrolled.  PCP decided to refer him to endocrinology at that time.  He ran out MMI in 03/2018 >> he was off the medication for approximately 1 month.  He felt that his heat intolerance improved after he stopped methimazole. At that time, we checked his TFTs and they were much improved, with now normal free thyroid hormones and improved TSH.  His TSI antibodies were not elevated.  Therefore, I advised him to stay off methimazole pending repeat TFTs in 1.5 months.  However, he did not return for labs.  In 07/2018, his TSH was still low and he is free T4 was elevated.  We started methimazole 5 mg daily in 07/2018 with improvement of his TFTs in 08/2018 but TSH remained undetectable in 10/2018 and I advised him to increase the dose to 7.5 mg daily in 10/2018.  In 11/2018 he told me that he ran out of methimazole in 08/2018 due to lack of refills.   He did not let me know.  However, in 11/2018 his TFTs were better so we did not restart methimazole.  I advised him to come back for labs in 1.5 months, but he did not do so.  In 03/2019, we had to restart methimazole 5 mg daily.  His TFTs were in the thyrotoxic range.  Subsequent TFTs were normal in 05/2019.  We continued the same dose of methimazole.  In 10/2019, TFTs were normal, so we decreased the methimazole dose to 2.5 mg daily.  In 12/2019, TFTs were normal.   In 02/2020, we stopped methimazole.  In 05/2021, we restarted methimazole 5 mg 2x a day.  08/10/2021: RAI uptake and scan: At 4 hours, 37.1%, at 24 hours, 57.7% uptake; uniform scan, pyramidal lobe evident  09/02/2021: RAI treatment    In 10/2021, we started LT4 50 mcg daily.   In 11/2021, we increased LT4 to 100 mcg daily.   Since then, we gradually increased the dose to 175 mcg daily (last dose change 05/2022).  He takes levothyroxine: - daily - in am (3:30-4 am) - fasting - at least 30 min from b'fast - no calcium - no iron - + multivitamins >4h later - no PPIs - not on Biotin  Reviewed his TFTs: Lab Results  Component Value Date   TSH 19.69 (H) 05/19/2022   TSH 35.55 (H) 03/03/2022   TSH 43.72 (H) 01/20/2022   TSH 63.500 (H) 11/25/2021   TSH 2.18 10/14/2021   TSH 0.02 Repeated and  verified X2. (L) 07/15/2021   TSH 0.008 (L) 05/20/2021   TSH 1.74 11/19/2020   TSH 1.69 07/16/2020   TSH 0.81 04/23/2020   FREET4 0.88 05/19/2022   FREET4 1.0 03/03/2022   FREET4 0.76 01/20/2022   FREET4 0.40 (L) 11/25/2021   FREET4 0.61 10/14/2021   FREET4 1.26 07/15/2021   FREET4 2.63 (H) 05/20/2021   FREET4 1.11 11/19/2020   FREET4 1.01 07/16/2020   FREET4 1.11 04/23/2020   T3FREE 2.0 (L) 10/14/2021   T3FREE 3.5 07/15/2021   T3FREE 7.0 (H) 05/20/2021   T3FREE 3.3 11/19/2020   T3FREE 3.3 07/16/2020   T3FREE 3.3 04/23/2020   T3FREE 3.6 02/27/2020   T3FREE 3.6 12/19/2019   T3FREE 3.4 10/17/2019   T3FREE 3.7  08/01/2019  11/10/2017: TSH 0.008, free T4 1.96 (0.82-1.77): Free T3 4.33 (2.0-4.4)  His TSI antibodies were not elevated: Lab Results  Component Value Date   TSI 123 04/19/2018   Pt denies: - feeling nodules in neck - hoarseness - dysphagia - choking  Pt does not have a FH of thyroid ds. No FH of thyroid cancer. No h/o radiation tx to head or neck. No Biotin use. No recent steroids use.   He has no other medical history.  ROS: + See HPI  I reviewed pt's medications, allergies, PMH, social hx, family hx, and changes were documented in the history of present illness. Otherwise, unchanged from my initial visit note.  PMH: - see HPI  Social History   Socioeconomic History   Marital status: Married    Spouse name: Not on file   Number of children: Not on file   Years of education: Not on file   Highest education level: Not on file  Occupational History   Not on file  Tobacco Use   Smoking status: Never   Smokeless tobacco: Never  Vaping Use   Vaping Use: Never used  Substance and Sexual Activity   Alcohol use: Never   Drug use: Never   Sexual activity: Not on file  Other Topics Concern   Not on file  Social History Narrative   Not on file   Social Determinants of Health   Financial Resource Strain: Not on file  Food Insecurity: Not on file  Transportation Needs: Not on file  Physical Activity: Not on file  Stress: Not on file  Social Connections: Not on file  Intimate Partner Violence: Not on file   Current Outpatient Medications  Medication Sig Dispense Refill   levothyroxine (SYNTHROID) 175 MCG tablet Take 1 tablet (175 mcg total) by mouth daily before breakfast. 60 tablet 2   No current facility-administered medications for this visit.   No Known Allergies   No family history on file.  PE: There were no vitals taken for this visit. Wt Readings from Last 3 Encounters:  01/20/22 235 lb 12.8 oz (107 kg)  10/14/21 228 lb 6.4 oz (103.6 kg)   07/15/21 241 lb 3.2 oz (109.4 kg)   Constitutional: overweight, in NAD Eyes: no exophthalmos ENT: no masses palpated in neck, no cervical lymphadenopathy Cardiovascular: RRR, No MRG Respiratory: CTA B Musculoskeletal: no deformities Skin: moist, warm, no rashes Neurological: no tremor with outstretched hands  ASSESSMENT: 1. H/o Graves ds. - s/p RAI tx  2. Postablative hypothyroidism  PLAN:  1. And 2. Patient with history of Graves' disease, with initial thyrotoxic symptoms: Weight loss, heat intolerance, both resolved.  TSI antibodies were not elevated.  He was initially started on a high dose of methimazole,  20 mg 3 times a day, for a year, after which he stopped the medication in 2019 and mentions that he felt better off of it.  We kept him off methimazole for a period of time but he again developed thyrotoxicosis and we had to start it again, at 5 mg daily.  He took this for a month but then ran out and could not refill it.  We had to restart it in 2020.  His TFTs normalized but at our visit in 05/2021, they were thyrotoxic again.  This was his 3rd recurrence.  I therefore recommended RAI treatment. -We checked a thyroid uptake and scan (08/10/2021), which showed uptake at both 4 hours and 24 hours time points.  The scan was uniform, and the pyramidal lobe was evident, confirming Graves' disease. -He had RAI treatment in 08/2021 and developed hypothyroidism afterwards.  Unfortunately, despite increase in LT4 doses, his TSH remained elevated, although improving: Lab Results  Component Value Date   TSH 19.69 (H) 05/19/2022  - he is currently on levothyroxine 175 mcg daily, dose increased after the above results returned - pt describes heat intolerance, irritability, sleepiness after meals and muscle cramps.  We discussed that some of these symptoms could be related to hypothyroidism, some to thyrotoxicosis, and some unrelated.  We did discuss that we first need to normalize his thyroid  tests and then see if he still has the symptoms afterwards.  If so, he will definitely need to see his PCP for further investigation. - we discussed about taking the thyroid hormone every day, with water, >30 minutes before breakfast, separated by >4 hours from acid reflux medications, calcium, iron, multivitamins. Pt. is taking it correctly. - will check thyroid tests today: TSH and fT4 - If labs are abnormal, he will need to return for repeat TFTs in 1.5 months - I will see him back in 6 months, but likely sooner for labs   Component     Latest Ref Rng 07/21/2022  TSH     0.450 - 4.500 uIU/mL 9.390 (H)   T4,Free(Direct)     0.82 - 1.77 ng/dL 8.11   TSH is improved.  Due to the improvement, my suggestion for now would be to try to stay on the same dose of LT4 and recheck his test in 1.5 months. Regarding his symptoms, I will encourage her to establish care with a PCP.  He needs further investigation for these, as I do not feel that they are related to his hypothyroidism.  Tyler Pavlov, MD PhD [facility] Endocrinology

## 2022-07-21 NOTE — Patient Instructions (Signed)
Please stop at the lab.  Please continue levothyroxine 175 mcg daily, every day, with water, at least 30 minutes before breakfast, separated by at least 4 hours from: - acid reflux medications - calcium - iron - multivitamins  Please come back for another visit in 6 months, but likely sooner for labs.

## 2022-07-22 LAB — T4, FREE: Free T4: 1.77 ng/dL (ref 0.82–1.77)

## 2022-07-22 LAB — TSH: TSH: 9.39 u[IU]/mL — ABNORMAL HIGH (ref 0.450–4.500)

## 2022-09-08 ENCOUNTER — Other Ambulatory Visit (INDEPENDENT_AMBULATORY_CARE_PROVIDER_SITE_OTHER): Payer: Commercial Managed Care - PPO

## 2022-09-08 DIAGNOSIS — E890 Postprocedural hypothyroidism: Secondary | ICD-10-CM | POA: Diagnosis not present

## 2022-09-08 NOTE — Addendum Note (Signed)
Addended by: Sumner Boast on: 09/08/2022 03:29 PM   Modules accepted: Orders

## 2022-09-09 LAB — T4, FREE: Free T4: 1.4 ng/dL (ref 0.8–1.8)

## 2022-09-09 LAB — TSH: TSH: 11.25 mIU/L — ABNORMAL HIGH (ref 0.40–4.50)

## 2022-09-11 MED ORDER — LEVOTHYROXINE SODIUM 200 MCG PO TABS: 200.0000 ug | ORAL_TABLET | Freq: Every day | ORAL | 3 refills | Status: DC

## 2022-09-11 NOTE — Addendum Note (Signed)
Addended by: Carlus Pavlov on: 09/11/2022 04:08 PM   Modules accepted: Orders

## 2022-10-27 ENCOUNTER — Other Ambulatory Visit: Payer: Commercial Managed Care - PPO

## 2022-10-27 DIAGNOSIS — E890 Postprocedural hypothyroidism: Secondary | ICD-10-CM

## 2022-10-27 LAB — TSH: TSH: 6.83 u[IU]/mL — ABNORMAL HIGH (ref 0.35–5.50)

## 2022-10-27 LAB — T4, FREE: Free T4: 1.23 ng/dL (ref 0.60–1.60)

## 2022-12-01 ENCOUNTER — Other Ambulatory Visit: Payer: Self-pay | Admitting: Podiatry

## 2022-12-01 ENCOUNTER — Ambulatory Visit: Payer: Commercial Managed Care - PPO | Admitting: Podiatry

## 2022-12-01 ENCOUNTER — Encounter: Payer: Self-pay | Admitting: Podiatry

## 2022-12-01 DIAGNOSIS — Z79899 Other long term (current) drug therapy: Secondary | ICD-10-CM | POA: Diagnosis not present

## 2022-12-01 DIAGNOSIS — B351 Tinea unguium: Secondary | ICD-10-CM | POA: Diagnosis not present

## 2022-12-01 NOTE — Progress Notes (Signed)
Subjective:  Patient ID: Tyler Gutierrez, male    DOB: 1955/08/25,  MRN: [id number]  Chief Complaint  Patient presents with   Nail Problem    Nail fungus    67 y.o. male presents with the above complaint.  Patient presents with bilateral hallux thickened onychodystrophy mycotic toenails x 2.  He has tried over-the-counter option which has not helped and would like to discuss other treatment options for nail fungus.  He has not seen and was prior to seeing me pain scale is 2 out of 10 slightly dull achy with the thickness.  Denies any other acute issues.  He denies any liver issues   Review of Systems: Negative except as noted in the HPI. Denies N/V/F/Ch.  No past medical history on file.  Current Outpatient Medications:    levothyroxine (SYNTHROID) 200 MCG tablet, Take 1 tablet (200 mcg total) by mouth daily., Disp: 45 tablet, Rfl: 3  Social History   Tobacco Use  Smoking Status Never  Smokeless Tobacco Never    No Known Allergies Objective:  There were no vitals filed for this visit. There is no height or weight on file to calculate BMI. Constitutional Well developed. Well nourished.  Vascular Dorsalis pedis pulses palpable bilaterally. Posterior tibial pulses palpable bilaterally. Capillary refill normal to all digits.  No cyanosis or clubbing noted. Pedal hair growth normal.  Neurologic Normal speech. Oriented to person, place, and time. Epicritic sensation to light touch grossly present bilaterally.  Dermatologic Nails thickened onychodystrophy mycotic toenails x 2 Skin within normal limits  Orthopedic: Normal joint ROM without pain or crepitus bilaterally. No visible deformities. No bony tenderness.   Radiographs: None Assessment:   1. Long-term use of high-risk medication   2. Nail fungus   3. Onychomycosis due to dermatophyte    Plan:  Patient was evaluated and treated and all questions answered.  Bilateral hallux onychomycosis -Educated the  patient on the etiology of onychomycosis and various treatment options associated with improving the fungal load.  I explained to the patient that there is 3 treatment options available to treat the onychomycosis including topical, p.o., laser treatment.  Patient elected to undergo p.o. options with Lamisil/terbinafine therapy.  In order for me to start the medication therapy, I explained to the patient the importance of evaluating the liver and obtaining the liver function test.  Once the liver function test comes back normal I will start him on 33-month course of Lamisil therapy.  Patient understood all risk and would like to proceed with Lamisil therapy.  I have asked the patient to immediately stop the Lamisil therapy if she has any reactions to it and call the office or go to the emergency room right away.  Patient states understanding   No follow-ups on file.

## 2022-12-02 LAB — HEPATIC FUNCTION PANEL
ALT: 32 IU/L (ref 0–44)
AST: 24 IU/L (ref 0–40)
Albumin: 4.3 g/dL (ref 3.9–4.9)
Alkaline Phosphatase: 87 IU/L (ref 44–121)
Bilirubin Total: 0.3 mg/dL (ref 0.0–1.2)
Bilirubin, Direct: 0.11 mg/dL (ref 0.00–0.40)
Total Protein: 6.9 g/dL (ref 6.0–8.5)

## 2022-12-04 ENCOUNTER — Telehealth: Payer: Self-pay | Admitting: Podiatry

## 2022-12-04 MED ORDER — TERBINAFINE HCL 250 MG PO TABS: 250.0000 mg | ORAL_TABLET | Freq: Every day | ORAL | 0 refills | Status: DC

## 2022-12-04 NOTE — Addendum Note (Signed)
Addended by: Nicholes Rough on: 12/04/2022 09:48 AM   Modules accepted: Orders

## 2022-12-04 NOTE — Telephone Encounter (Signed)
Pt calling about his results. Wants to know If he is ok to take the medication.

## 2023-01-08 ENCOUNTER — Telehealth: Payer: Self-pay

## 2023-01-08 NOTE — Telephone Encounter (Signed)
Spoke with patient and he will check with pharmacy about medication and why they only gave him 30 tablets.

## 2023-01-08 NOTE — Telephone Encounter (Signed)
Patient called wanting to know if he is to continue taking Lamisil since there was no refill on medication.

## 2023-01-19 ENCOUNTER — Ambulatory Visit: Payer: Commercial Managed Care - PPO | Admitting: Internal Medicine

## 2023-02-16 ENCOUNTER — Ambulatory Visit: Payer: Commercial Managed Care - PPO | Admitting: Internal Medicine

## 2023-03-02 ENCOUNTER — Ambulatory Visit: Payer: Commercial Managed Care - PPO | Admitting: Podiatry

## 2023-03-02 DIAGNOSIS — B351 Tinea unguium: Secondary | ICD-10-CM | POA: Diagnosis not present

## 2023-03-02 DIAGNOSIS — Z79899 Other long term (current) drug therapy: Secondary | ICD-10-CM

## 2023-03-02 NOTE — Progress Notes (Signed)
  Subjective:  Patient ID: Tyler Gutierrez, male    DOB: Mar 19, 1955,  MRN: [id number]  No chief complaint on file.   67 y.o. male presents with the above complaint.  Patient presents with complaint bilateral hallux thickened and onychodystrophy mycotic nail.  Patient states is doing better the Lamisil helped.  He would like to do another round  Review of Systems: Negative except as noted in the HPI. Denies N/V/F/Ch.  No past medical history on file.  Current Outpatient Medications:    levothyroxine (SYNTHROID) 200 MCG tablet, Take 1 tablet (200 mcg total) by mouth daily., Disp: 45 tablet, Rfl: 3   terbinafine (LAMISIL) 250 MG tablet, Take 1 tablet (250 mg total) by mouth daily., Disp: 90 tablet, Rfl: 0  Social History   Tobacco Use  Smoking Status Never  Smokeless Tobacco Never    No Known Allergies Objective:  There were no vitals filed for this visit. There is no height or weight on file to calculate BMI. Constitutional Well developed. Well nourished.  Vascular Dorsalis pedis pulses palpable bilaterally. Posterior tibial pulses palpable bilaterally. Capillary refill normal to all digits.  No cyanosis or clubbing noted. Pedal hair growth normal.  Neurologic Normal speech. Oriented to person, place, and time. Epicritic sensation to light touch grossly present bilaterally.  Dermatologic Nails thickened onychodystrophy mycotic toenails x 2 improving Skin within normal limits  Orthopedic: Normal joint ROM without pain or crepitus bilaterally. No visible deformities. No bony tenderness.   Radiographs: None Assessment:   1. Long-term use of high-risk medication   2. Nail fungus   3. Onychomycosis due to dermatophyte    Plan:  Patient was evaluated and treated and all questions answered.  Bilateral hallux onychomycosis~second round  -Educated the patient on the etiology of onychomycosis and various treatment options associated with improving the fungal load.   I explained to the patient that there is 3 treatment options available to treat the onychomycosis including topical, p.o., laser treatment.  Patient elected to undergo p.o. options with Lamisil/terbinafine therapy.  In order for me to start the medication therapy, I explained to the patient the importance of evaluating the liver and obtaining the liver function test.  Once the liver function test comes back normal I will start him on 39-month course of Lamisil therapy.  Patient understood all risk and would like to proceed with Lamisil therapy.  I have asked the patient to immediately stop the Lamisil therapy if she has any reactions to it and call the office or go to the emergency room right away.  Patient states understanding   No follow-ups on file.

## 2023-03-09 ENCOUNTER — Ambulatory Visit: Payer: Commercial Managed Care - PPO | Admitting: Internal Medicine

## 2023-03-09 ENCOUNTER — Encounter: Payer: Self-pay | Admitting: Internal Medicine

## 2023-03-09 VITALS — BP 128/86 | HR 75 | Resp 16 | Ht 67.0 in | Wt 234.6 lb

## 2023-03-09 DIAGNOSIS — E890 Postprocedural hypothyroidism: Secondary | ICD-10-CM

## 2023-03-09 DIAGNOSIS — Z8639 Personal history of other endocrine, nutritional and metabolic disease: Secondary | ICD-10-CM

## 2023-03-09 NOTE — Patient Instructions (Signed)
Please stop at the lab.  Please continue levothyroxine 200 mcg daily.  Take this every day, with water, at least 30 minutes before breakfast, separated by at least 4 hours from: - acid reflux medications - calcium - iron - multivitamins  Please come back for another visit in 6 months, but likely sooner for labs.

## 2023-03-09 NOTE — Progress Notes (Signed)
Patient ID: Tyler Gutierrez, male   DOB: 1955-05-12, 67 y.o.   MRN: [id number]   HPI  Tyler Gutierrez is a 67 y.o.-year-old male, initially referred by his PCP, Dr. Purnell Shoemaker, returning for follow-up for Graves disease.  Last visit 6 months ago.  Interim history: No palpitations, tremors.  No significant weight gain or cold intolerance. At today's visit, he still mentions sweating, irritability, sleepiness after meals, mm cramps. He feels better now - but has rib muscle cramps.  Reviewed and addended history: He was dx'ed with hyperthyroidism in 2016 by screening at work. Retrospectively, at that time, he lost 20 lbs in 3-4 mo; increased heat intolerance and sweating.  12/31/2014: Thyroid uptake and scan: Consistent with Graves' disease: Markedly diffuse increased uptake throughout both lobes of the thyroid gland identified. No dominant hot or cold nodules. The 4 hour radioactive iodine uptake is equal to 89.5%. The 24 hour radioactive iodine uptake is equal to 99.7%.  He saw hs PCP >> started methimazole >> 20 mg 3x a day.  However, he was not very compliant with his medication.  TFTs remained uncontrolled.  PCP decided to refer him to endocrinology at that time.  He ran out MMI in 03/2018 >> he was off the medication for approximately 1 month.  He felt that his heat intolerance improved after he stopped methimazole. At that time, we checked his TFTs and they were much improved, with now normal free thyroid hormones and improved TSH.  His TSI antibodies were not elevated.  Therefore, I advised him to stay off methimazole pending repeat TFTs in 1.5 months.  However, he did not return for labs.  In 07/2018, his TSH was still low and he is free T4 was elevated.  We started methimazole 5 mg daily in 07/2018 with improvement of his TFTs in 08/2018 but TSH remained undetectable in 10/2018 and I advised him to increase the dose to 7.5 mg daily in 10/2018.  In 11/2018 he told me that  he ran out of methimazole in 08/2018 due to lack of refills.  He did not let me know.  However, in 11/2018 his TFTs were better so we did not restart methimazole.  I advised him to come back for labs in 1.5 months, but he did not do so.  In 03/2019, we had to restart methimazole 5 mg daily.  His TFTs were in the thyrotoxic range.  Subsequent TFTs were normal in 05/2019.  We continued the same dose of methimazole.  In 10/2019, TFTs were normal, so we decreased the methimazole dose to 2.5 mg daily.  In 12/2019, TFTs were normal.   In 02/2020, we stopped methimazole.  In 05/2021, we restarted methimazole 5 mg 2x a day.  08/10/2021: RAI uptake and scan: At 4 hours, 37.1%, at 24 hours, 57.7% uptake; uniform scan, pyramidal lobe evident  09/02/2021: RAI treatment    In 10/2021, we started LT4 50 mcg daily.   In 11/2021, we increased LT4 to 100 mcg daily.   Since then, we gradually increased the dose to 175 mcg daily -  05/2022.   In 09/2022, we increased the LT4 dose to 200 mcg daily.  He takes levothyroxine: - daily - in am - fasting - at least 2h from b'fast - no calcium - no iron - stopped multivitamins  - no PPIs - not on Biotin  Reviewed his TFTs: Lab Results  Component Value Date   TSH 6.83 (H) 10/27/2022   TSH 11.25 (H) 09/08/2022  TSH 9.390 (H) 07/21/2022   TSH 19.69 (H) 05/19/2022   TSH 35.55 (H) 03/03/2022   TSH 43.72 (H) 01/20/2022   TSH 63.500 (H) 11/25/2021   TSH 2.18 10/14/2021   TSH 0.02 Repeated and verified X2. (L) 07/15/2021   TSH 0.008 (L) 05/20/2021   FREET4 1.23 10/27/2022   FREET4 1.4 09/08/2022   FREET4 1.77 07/21/2022   FREET4 0.88 05/19/2022   FREET4 1.0 03/03/2022   FREET4 0.76 01/20/2022   FREET4 0.40 (L) 11/25/2021   FREET4 0.61 10/14/2021   FREET4 1.26 07/15/2021   FREET4 2.63 (H) 05/20/2021   T3FREE 2.0 (L) 10/14/2021   T3FREE 3.5 07/15/2021   T3FREE 7.0 (H) 05/20/2021   T3FREE 3.3 11/19/2020   T3FREE 3.3 07/16/2020   T3FREE 3.3  04/23/2020   T3FREE 3.6 02/27/2020   T3FREE 3.6 12/19/2019   T3FREE 3.4 10/17/2019   T3FREE 3.7 08/01/2019  11/10/2017: TSH 0.008, free T4 1.96 (0.82-1.77): Free T3 4.33 (2.0-4.4)  His TSI antibodies were not elevated: Lab Results  Component Value Date   TSI 123 04/19/2018   Pt denies: - feeling nodules in neck - hoarseness - dysphagia - choking  Pt does not have a FH of thyroid ds. No FH of thyroid cancer. No h/o radiation tx to head or neck. No Biotin use. No recent steroids use.   He has no other medical history.  ROS: + See HPI  I reviewed pt's medications, allergies, PMH, social hx, family hx, and changes were documented in the history of present illness. Otherwise, unchanged from my initial visit note.  PMH: - see HPI  Social History   Socioeconomic History   Marital status: Married    Spouse name: Not on file   Number of children: Not on file   Years of education: Not on file   Highest education level: Not on file  Occupational History   Not on file  Tobacco Use   Smoking status: Never   Smokeless tobacco: Never  Vaping Use   Vaping status: Never Used  Substance and Sexual Activity   Alcohol use: Never   Drug use: Never   Sexual activity: Not on file  Other Topics Concern   Not on file  Social History Narrative   Not on file   Social Drivers of Health   Financial Resource Strain: Not on file  Food Insecurity: Not on file  Transportation Needs: Not on file  Physical Activity: Not on file  Stress: Not on file  Social Connections: Not on file  Intimate Partner Violence: Not on file   Current Outpatient Medications  Medication Sig Dispense Refill   levothyroxine (SYNTHROID) 200 MCG tablet Take 1 tablet (200 mcg total) by mouth daily. 45 tablet 3   terbinafine (LAMISIL) 250 MG tablet Take 1 tablet (250 mg total) by mouth daily. 90 tablet 0   No current facility-administered medications for this visit.   No Known Allergies   No family history  on file.  PE: BP 128/86   Pulse 75   Resp 16   Ht 5\' 7"  (1.702 m)   Wt 234 lb 9.6 oz (106.4 kg)   SpO2 98%   BMI 36.74 kg/m  Wt Readings from Last 3 Encounters:  03/09/23 234 lb 9.6 oz (106.4 kg)  07/21/22 239 lb 9.6 oz (108.7 kg)  01/20/22 235 lb 12.8 oz (107 kg)   Constitutional: overweight, in NAD Eyes: no exophthalmos ENT: no masses palpated in neck, no cervical lymphadenopathy Cardiovascular: RRR, No MRG Respiratory:  CTA B Musculoskeletal: no deformities Skin:  no rashes Neurological: no tremor with outstretched hands  ASSESSMENT: 1. H/o Graves ds. - s/p RAI tx  2. Postablative hypothyroidism  PLAN:  1. And 2. Patient with history of Graves' disease, with initial thyrotoxic symptoms: weight loss, heat intolerance, both resolved.  His TSI antibodies were not elevated.  He was initially started on a high dose of methimazole, 12 mg 3 times a day for a year, after which he stopped the medication in 2019 and mentions that he felt better off the medication.  We kept him off methimazole for period of time but he then developed thyrotoxicosis again.  He did not with a total of 3 recurrences so I recommended RAI treatment.  He had a thyroid uptake and scan in 07/2021 showing increased uptake at both 4 and 24 hours.  The scan was uniform and a rate of the thyroid pyramidal lobe was evident, confirming Graves' disease.  He had RAI treatment in 08/2021 and developed hypothyroidism afterwards. Unfortunately, this is still uncontrolled, but improving. - latest thyroid labs reviewed with pt. >> TSH was still elevated Lab Results  Component Value Date   TSH 6.83 (H) 10/27/2022  - he continues on LT4 200 mcg daily - pt feels good on this dose.  At last visit he was describing heat intolerance, irritability and sleepiness after meals along with muscle cramps.  We did discuss that this may not be related to his thyroid condition if they did not improve with normalization of his TFTs, but  will check with PCP about them.  At today's visit, he is feeling better, without heat intolerance and irritability.  She still has some muscle cramps, in his ribs. - we discussed about taking the thyroid hormone every day, with water, >30 minutes before breakfast, separated by >4 hours from acid reflux medications, calcium, iron, multivitamins. Pt. is taking it correctly. - will check thyroid tests today: TSH and fT4 - If labs are abnormal, he will need to return for repeat TFTs in 1.5 months -I will see him back in 6 months but possibly sooner for labs.  Orders Placed This Encounter  Procedures   TSH   T4, free   He needs refills.  Component     Latest Ref Rng 03/09/2023  TSH     0.40 - 4.50 mIU/L 14.66 (H)   T4,Free(Direct)     0.8 - 1.8 ng/dL 1.4   TSH is, surprisingly, higher.  Will go ahead and increase his LT4 dose to 225 mcg daily and repeat the test in 1.5 months.  Carlus Pavlov, MD PhD [facility]

## 2023-03-10 LAB — T4, FREE: Free T4: 1.4 ng/dL (ref 0.8–1.8)

## 2023-03-10 LAB — TSH: TSH: 14.66 m[IU]/L — ABNORMAL HIGH (ref 0.40–4.50)

## 2023-03-12 MED ORDER — LEVOTHYROXINE SODIUM 25 MCG PO TABS
25.0000 ug | ORAL_TABLET | Freq: Every day | ORAL | 3 refills | Status: DC
Start: 2023-03-12 — End: 2023-09-03

## 2023-03-12 MED ORDER — LEVOTHYROXINE SODIUM 200 MCG PO TABS: 200.0000 ug | ORAL_TABLET | Freq: Every day | ORAL | 3 refills | Status: DC

## 2023-03-12 NOTE — Addendum Note (Signed)
Addended by: Carlus Pavlov on: 03/12/2023 10:06 AM   Modules accepted: Orders

## 2023-03-23 ENCOUNTER — Other Ambulatory Visit: Payer: Self-pay | Admitting: Podiatry

## 2023-03-24 LAB — HEPATIC FUNCTION PANEL
ALT: 29 [IU]/L (ref 0–44)
AST: 25 [IU]/L (ref 0–40)
Albumin: 4.5 g/dL (ref 3.9–4.9)
Alkaline Phosphatase: 102 [IU]/L (ref 44–121)
Bilirubin Total: 0.3 mg/dL (ref 0.0–1.2)
Bilirubin, Direct: 0.1 mg/dL (ref 0.00–0.40)
Total Protein: 7.2 g/dL (ref 6.0–8.5)

## 2023-03-26 MED ORDER — TERBINAFINE HCL 250 MG PO TABS: 250.0000 mg | ORAL_TABLET | Freq: Every day | ORAL | 0 refills | Status: DC

## 2023-03-26 NOTE — Addendum Note (Signed)
 Addended by: Nicholes Rough on: 03/26/2023 07:40 AM   Modules accepted: Orders

## 2023-05-04 ENCOUNTER — Other Ambulatory Visit: Payer: Commercial Managed Care - PPO

## 2023-05-05 LAB — T4, FREE: Free T4: 1.8 ng/dL (ref 0.8–1.8)

## 2023-05-05 LAB — TSH: TSH: 4.41 m[IU]/L (ref 0.40–4.50)

## 2023-05-08 ENCOUNTER — Encounter: Payer: Self-pay | Admitting: Internal Medicine

## 2023-07-05 ENCOUNTER — Telehealth: Payer: Self-pay

## 2023-07-05 DIAGNOSIS — E890 Postprocedural hypothyroidism: Secondary | ICD-10-CM

## 2023-07-05 NOTE — Telephone Encounter (Signed)
 Orders Placed This Encounter  Procedures   TSH   T4, free

## 2023-07-06 ENCOUNTER — Other Ambulatory Visit

## 2023-07-06 ENCOUNTER — Ambulatory Visit: Payer: Commercial Managed Care - PPO | Admitting: Podiatry

## 2023-07-06 DIAGNOSIS — Z79899 Other long term (current) drug therapy: Secondary | ICD-10-CM

## 2023-07-06 DIAGNOSIS — B351 Tinea unguium: Secondary | ICD-10-CM | POA: Diagnosis not present

## 2023-07-06 NOTE — Progress Notes (Signed)
  Subjective:  Patient ID: Tyler Gutierrez, male    DOB: [birth year],  MRN: [id number]  Chief Complaint  Patient presents with   Nail Problem    Nail fungus follow up     68 y.o. male presents with the above complaint.  Patient is for follow-up bilateral hallux nail fungus he states is doing a lot better denies any other acute issues.  Noted in the HPI. Denies N/V/F/Ch.  No past medical history on file.  Current Outpatient Medications:    levothyroxine  (SYNTHROID ) 200 MCG tablet, Take 1 tablet (200 mcg total) by mouth daily., Disp: 45 tablet, Rfl: 3   levothyroxine  (SYNTHROID ) 25 MCG tablet, Take 1 tablet (25 mcg total) by mouth daily., Disp: 45 tablet, Rfl: 3   terbinafine  (LAMISIL ) 250 MG tablet, Take 1 tablet (250 mg total) by mouth daily., Disp: 90 tablet, Rfl: 0   terbinafine  (LAMISIL ) 250 MG tablet, Take 1 tablet (250 mg total) by mouth daily., Disp: 90 tablet, Rfl: 0  Social History   Tobacco Use  Smoking Status Never  Smokeless Tobacco Never    No Known Allergies Objective:  There were no vitals filed for this visit. There is no height or weight on file to calculate BMI. Constitutional Well developed. Well nourished.  Vascular Dorsalis pedis pulses palpable bilaterally. Posterior tibial pulses palpable bilaterally. Capillary refill normal to all digits.  No cyanosis or clubbing noted. Pedal hair growth normal.  Neurologic Normal speech. Oriented to person, place, and time. Epicritic sensation to light touch grossly present bilaterally.  Dermatologic No further nails thickened onychodystrophy mycotic toenails x 2 improving Skin within normal limits  Orthopedic: Normal joint ROM without pain or crepitus bilaterally. No visible deformities. No bony tenderness.   Radiographs: None Assessment:   No diagnosis found.  Plan:  Patient was evaluated and treated and all questions answered.  Bilateral hallux onychomycosis~second round  - Clinically healed  and officially discharged from our care discussed with him if it reoccurs he will come back and see me.  I discussed prevention technique.  He states understanding.  No follow-ups on file.

## 2023-07-07 LAB — T4, FREE: Free T4: 1.8 ng/dL (ref 0.8–1.8)

## 2023-07-07 LAB — TSH: TSH: 5.91 m[IU]/L — ABNORMAL HIGH (ref 0.40–4.50)

## 2023-07-09 ENCOUNTER — Encounter: Payer: Self-pay | Admitting: Internal Medicine

## 2023-08-31 ENCOUNTER — Encounter: Payer: Self-pay | Admitting: Internal Medicine

## 2023-08-31 ENCOUNTER — Ambulatory Visit: Payer: Commercial Managed Care - PPO | Admitting: Internal Medicine

## 2023-08-31 VITALS — BP 134/80 | HR 65 | Ht 67.0 in | Wt 238.0 lb

## 2023-08-31 DIAGNOSIS — Z8639 Personal history of other endocrine, nutritional and metabolic disease: Secondary | ICD-10-CM

## 2023-08-31 DIAGNOSIS — E890 Postprocedural hypothyroidism: Secondary | ICD-10-CM | POA: Diagnosis not present

## 2023-08-31 NOTE — Progress Notes (Signed)
 Patient ID: Tyler Gutierrez, male   DOB: 05/21/1955, 68 y.o.   MRN: [id number]   HPI  Tyler Gutierrez is a 68 y.o.-year-old male, initially referred by his PCP, Dr. Verdis Gutierrez, returning for follow-up for Graves disease.  Last visit 6 months ago.  Interim history: No palpitations, tremors.  No significant weight gain or cold intolerance. He noticed sweating more on his head than the rest of the body. At last visit, he still mentioned sweating, irritability, sleepiness after meals, mm cramps - mostly resolved.  He still has some fatigue, may fall asleep on the cough after meals.  Reviewed and addended history: He was dx'ed with hyperthyroidism in 2016 by screening at work. Retrospectively, at that time, he lost 20 lbs in 3-4 mo; increased heat intolerance and sweating.  12/31/2014: Thyroid  uptake and scan: Consistent with Graves' disease: Markedly diffuse increased uptake throughout both lobes of the thyroid  gland identified. No dominant hot or cold nodules. The 4 hour radioactive iodine uptake is equal to 89.5%. The 24 hour radioactive iodine uptake is equal to 99.7%.  He saw hs PCP >> started methimazole  >> 20 mg 3x a day.  However, he was not very compliant with his medication.  TFTs remained uncontrolled.  PCP decided to refer him to endocrinology at that time.  He ran out MMI in 03/2018 >> he was off the medication for approximately 1 month.  He felt that his heat intolerance improved after he stopped methimazole . At that time, we checked his TFTs and they were much improved, with now normal free thyroid  hormones and improved TSH.  His TSI antibodies were not elevated.  Therefore, I advised him to stay off methimazole  pending repeat TFTs in 1.5 months.  However, he did not return for labs.  In 07/2018, his TSH was still low and he is free T4 was elevated.  We started methimazole  5 mg daily in 07/2018 with improvement of his TFTs in 08/2018 but TSH remained undetectable in  10/2018 and I advised him to increase the dose to 7.5 mg daily in 10/2018.  In 11/2018 he told me that he ran out of methimazole  in 08/2018 due to lack of refills.  He did not let me know.  However, in 11/2018 his TFTs were better so we did not restart methimazole .  I advised him to come back for labs in 1.5 months, but he did not do so.  In 03/2019, we had to restart methimazole  5 mg daily.  His TFTs were in the thyrotoxic range.  Subsequent TFTs were normal in 05/2019.  We continued the same dose of methimazole .  In 10/2019, TFTs were normal, so we decreased the methimazole  dose to 2.5 mg daily.  In 12/2019, TFTs were normal.   In 02/2020, we stopped methimazole .  In 05/2021, we restarted methimazole  5 mg 2x a day.  08/10/2021: RAI uptake and scan: At 4 hours, 37.1%, at 24 hours, 57.7% uptake; uniform scan, pyramidal lobe evident  09/02/2021: RAI treatment    In 10/2021, we started LT4 50 mcg daily.   In 11/2021, we increased LT4 to 100 mcg daily.   Since then, we gradually increased the dose to 175 mcg daily -  05/2022.   In 09/2022, we increased the LT4 dose to 200 mcg daily.   In 02/2023, we increased his LT4 dose to 225 mcg daily.  He takes levothyroxine : - daily - in am - fasting - at least 2h from b'fast - no calcium - no iron - stopped  multivitamins  - no PPIs - not on Biotin supplements He tried to change his diet to help with levothyroxine  absorption.  He stopped milk and eats other forms of dairy very rarely. He is drinking Celsius-with 300 mcg of biotin-did not drink this today.  Reviewed his TFTs: Lab Results  Component Value Date   TSH 5.91 (H) 07/06/2023   TSH 4.41 05/04/2023   TSH 14.66 (H) 03/09/2023   TSH 6.83 (H) 10/27/2022   TSH 11.25 (H) 09/08/2022   TSH 9.390 (H) 07/21/2022   TSH 19.69 (H) 05/19/2022   TSH 35.55 (H) 03/03/2022   TSH 43.72 (H) 01/20/2022   TSH 63.500 (H) 11/25/2021   FREET4 1.8 07/06/2023   FREET4 1.8 05/04/2023   FREET4 1.4  03/09/2023   FREET4 1.23 10/27/2022   FREET4 1.4 09/08/2022   FREET4 1.77 07/21/2022   FREET4 0.88 05/19/2022   FREET4 1.0 03/03/2022   FREET4 0.76 01/20/2022   FREET4 0.40 (L) 11/25/2021   T3FREE 2.0 (L) 10/14/2021   T3FREE 3.5 07/15/2021   T3FREE 7.0 (H) 05/20/2021   T3FREE 3.3 11/19/2020   T3FREE 3.3 07/16/2020   T3FREE 3.3 04/23/2020   T3FREE 3.6 02/27/2020   T3FREE 3.6 12/19/2019   T3FREE 3.4 10/17/2019   T3FREE 3.7 08/01/2019  11/10/2017: TSH 0.008, free T4 1.96 (0.82-1.77): Free T3 4.33 (2.0-4.4)  His TSI antibodies were not elevated: Lab Results  Component Value Date   TSI 123 04/19/2018   Pt denies: - feeling nodules in neck - hoarseness - dysphagia - choking  Pt does not have a FH of thyroid  ds. No FH of thyroid  cancer. No h/o radiation tx to head or neck. No Biotin use. No recent steroids use.   He has no other medical history.  ROS: + See HPI  I reviewed pt's medications, allergies, PMH, social hx, family hx, and changes were documented in the history of present illness. Otherwise, unchanged from my initial visit note.  PMH: - see HPI  Social History   Socioeconomic History   Marital status: Married    Spouse name: Not on file   Number of children: Not on file   Years of education: Not on file   Highest education level: Not on file  Occupational History   Not on file  Tobacco Use   Smoking status: Never   Smokeless tobacco: Never  Vaping Use   Vaping status: Never Used  Substance and Sexual Activity   Alcohol use: Never   Drug use: Never   Sexual activity: Not on file  Other Topics Concern   Not on file  Social History Narrative   Not on file   Social Drivers of Health   Financial Resource Strain: Not on file  Food Insecurity: Not on file  Transportation Needs: Not on file  Physical Activity: Not on file  Stress: Not on file  Social Connections: Not on file  Intimate Partner Violence: Not on file   Current Outpatient Medications   Medication Sig Dispense Refill   levothyroxine  (SYNTHROID ) 200 MCG tablet Take 1 tablet (200 mcg total) by mouth daily. 45 tablet 3   levothyroxine  (SYNTHROID ) 25 MCG tablet Take 1 tablet (25 mcg total) by mouth daily. 45 tablet 3   terbinafine  (LAMISIL ) 250 MG tablet Take 1 tablet (250 mg total) by mouth daily. 90 tablet 0   terbinafine  (LAMISIL ) 250 MG tablet Take 1 tablet (250 mg total) by mouth daily. 90 tablet 0   No current facility-administered medications for this visit.  No Known Allergies   No family history on file.  PE: BP 134/80   Pulse 65   Ht 5' 7 (1.702 m)   Wt 238 lb (108 kg)   SpO2 97%   BMI 37.28 kg/m  Wt Readings from Last 3 Encounters:  08/31/23 238 lb (108 kg)  03/09/23 234 lb 9.6 oz (106.4 kg)  07/21/22 239 lb 9.6 oz (108.7 kg)   Constitutional: overweight, in NAD Eyes: no exophthalmos ENT: no masses palpated in neck, no cervical lymphadenopathy Cardiovascular: RRR, No MRG Respiratory: CTA B Musculoskeletal: no deformities Skin:  no rashes Neurological: no tremor with outstretched hands  ASSESSMENT: 1. H/o Graves ds. - s/p RAI tx  2. Postablative hypothyroidism  PLAN:  1. And 2. Patient with history of Graves' disease, with initial thyrotoxic symptoms: Weight loss, heat intolerance, both resolved.  His TSI antibodies were not elevated.  He was initially started on a high methimazole  dose, which she subsequently stopped mentioning that he felt better off the medication.  However, we had to restart methimazole  afterwards with still uncontrolled thyroid  tests (a total of 3 recurrences) so we decided to proceed with radioactive iodine treatment.  We initially checked a thyroid  uptake and scan (07/2021), showing increased uptake at both 4 and 24 hours.  The scan was uniform and he had a visible thyroid  lobe, confirming Graves' disease.  He had RAI treatment in 08/2021 and developed post ablative hypothyroidism.  We continue to adjust his levothyroxine   dose.  He appears to require high doses. - thyroid  labs reviewed with pt. >> TSH elevated in 02/2023, at 14.66, so we increased his LT4 dose from 200 to 225 mcg daily.  TSH improved to normal initially, but at last check, 2 months ago, the TSH was again elevated: Lab Results  Component Value Date   TSH 5.91 (H) 07/06/2023  - I advised him to continue the same dose of LT4 at that time pending another set of labs at today's visit - he continues on LT4 225 mcg daily.  We discussed about possibly switching to brand-name Synthroid  to help with variability between levothyroxine  loss, but this could be expensive for him, so for now we will try to continue with the generic LT4. - pt feels good on this dose.  At last visit he had some muscle cramps in his rib area, but this resolved.  He does have some fatigue, but no hypothyroid symptoms. - we discussed about taking the thyroid  hormone every day, with water, >30 minutes before breakfast, separated by >4 hours from acid reflux medications, calcium, iron, multivitamins. Pt. is taking it correctly.  He is not skipping doses. - will check thyroid  tests today: TSH and fT4 - If labs are abnormal, he will need to return for repeat TFTs in 1.5 months - I will see him back in 6 months but possibly sooner for labs.  He needs refills.  Tyler Harden, MD PhD [facility]

## 2023-08-31 NOTE — Patient Instructions (Signed)
 Please stop at the lab.  Please continue levothyroxine  225 mcg daily.  Take this every day, with water, at least 30 minutes before breakfast, separated by at least 4 hours from: - acid reflux medications - calcium - iron - multivitamins  Please come back for another visit in 6 months, but likely sooner for labs.

## 2023-09-01 LAB — TSH: TSH: 3.78 m[IU]/L (ref 0.40–4.50)

## 2023-09-01 LAB — T4, FREE: Free T4: 1.8 ng/dL (ref 0.8–1.8)

## 2023-09-03 ENCOUNTER — Ambulatory Visit: Payer: Self-pay | Admitting: Internal Medicine

## 2023-09-03 MED ORDER — LEVOTHYROXINE SODIUM 25 MCG PO TABS: 25.0000 ug | ORAL_TABLET | Freq: Every day | ORAL | 1 refills | Status: DC

## 2023-09-03 MED ORDER — LEVOTHYROXINE SODIUM 200 MCG PO TABS: 200.0000 ug | ORAL_TABLET | Freq: Every day | ORAL | 1 refills | Status: DC

## 2023-09-03 NOTE — Addendum Note (Signed)
 Addended by: TRIXIE FILE on: 09/03/2023 11:59 AM   Modules accepted: Orders

## 2024-03-03 ENCOUNTER — Other Ambulatory Visit

## 2024-03-03 ENCOUNTER — Ambulatory Visit: Admitting: Internal Medicine

## 2024-03-03 ENCOUNTER — Encounter: Payer: Self-pay | Admitting: Internal Medicine

## 2024-03-03 VITALS — BP 124/80 | HR 76 | Ht 67.0 in | Wt 223.6 lb

## 2024-03-03 DIAGNOSIS — Z8639 Personal history of other endocrine, nutritional and metabolic disease: Secondary | ICD-10-CM

## 2024-03-03 DIAGNOSIS — E890 Postprocedural hypothyroidism: Secondary | ICD-10-CM | POA: Diagnosis not present

## 2024-03-03 NOTE — Patient Instructions (Signed)
 Please stop at the lab.  Please continue levothyroxine  225 mcg daily.  Take this every day, with water, at least 30 minutes before breakfast, separated by at least 4 hours from: - acid reflux medications - calcium - iron - multivitamins  Please come back for another visit in 6 months, but likely sooner for labs.

## 2024-03-03 NOTE — Progress Notes (Signed)
 Patient ID: Tyler Gutierrez, male   DOB: Aug 02, 1955, 68 y.o.   MRN: [id number]   HPI  Tyler Gutierrez is a 68 y.o.-year-old male, initially referred by his PCP, Dr. Walker, returning for follow-up for Graves disease.  Last visit 6 months ago.  Interim history: No palpitations, tremors.  No significant weight gain or cold intolerance.  He previously had sweating, irritability, sleepiness after meals, mm cramps - resolved.  He realized that these may have been related to an energy drink (Celsius) that he stopped since last visit. He continues to have some fatigue but improved. He started to adjust his diet >> lost weight-15 pounds since last visit  Reviewed history: He was dx'ed with hyperthyroidism in 2016 by screening at work. Retrospectively, at that time, he lost 20 lbs in 3-4 mo; increased heat intolerance and sweating.  12/31/2014: Thyroid  uptake and scan: Consistent with Graves' disease: Markedly diffuse increased uptake throughout both lobes of the thyroid  gland identified. No dominant hot or cold nodules. The 4 hour radioactive iodine uptake is equal to 89.5%. The 24 hour radioactive iodine uptake is equal to 99.7%.  He saw hs PCP >> started methimazole  >> 20 mg 3x a day.  However, he was not very compliant with his medication.  TFTs remained uncontrolled.  PCP decided to refer him to endocrinology at that time.  He ran out MMI in 03/2018 >> he was off the medication for approximately 1 month.  He felt that his heat intolerance improved after he stopped methimazole . At that time, we checked his TFTs and they were much improved, with now normal free thyroid  hormones and improved TSH.  His TSI antibodies were not elevated.  Therefore, I advised him to stay off methimazole  pending repeat TFTs in 1.5 months.  However, he did not return for labs.  In 07/2018, his TSH was still low and he is free T4 was elevated.  We started methimazole  5 mg daily in 07/2018 with  improvement of his TFTs in 08/2018 but TSH remained undetectable in 10/2018 and I advised him to increase the dose to 7.5 mg daily in 10/2018.  In 11/2018 he told me that he ran out of methimazole  in 08/2018 due to lack of refills.  He did not let me know.  However, in 11/2018 his TFTs were better so we did not restart methimazole .  I advised him to come back for labs in 1.5 months, but he did not do so.  In 03/2019, we had to restart methimazole  5 mg daily.  His TFTs were in the thyrotoxic range.  Subsequent TFTs were normal in 05/2019.  We continued the same dose of methimazole .  In 10/2019, TFTs were normal, so we decreased the methimazole  dose to 2.5 mg daily.  In 12/2019, TFTs were normal.   In 02/2020, we stopped methimazole .  In 05/2021, we restarted methimazole  5 mg 2x a day.  08/10/2021: RAI uptake and scan: At 4 hours, 37.1%, at 24 hours, 57.7% uptake; uniform scan, pyramidal lobe evident  09/02/2021: RAI treatment    In 10/2021, we started LT4 50 mcg daily.   In 11/2021, we increased LT4 to 100 mcg daily.   Since then, we gradually increased the dose to 175 mcg daily -  05/2022.   In 09/2022, we increased the LT4 dose to 200 mcg daily.   In 02/2023, we increased his LT4 dose to 225 mcg daily.  He takes levothyroxine : - daily - in am - fasting - at least 45 min -  1h from b'fast - no calcium - no iron - stopped multivitamins  - no PPIs - not on Biotin supplements He tried to change his diet to help with levothyroxine  absorption.  He stopped milk and eats other forms of dairy very rarely. He was drinking Celsius-with 300 mcg of biotin >> stopped 2/2 increased sweating.  Reviewed his TFTs: Lab Results  Component Value Date   TSH 3.78 08/31/2023   TSH 5.91 (H) 07/06/2023   TSH 4.41 05/04/2023   TSH 14.66 (H) 03/09/2023   TSH 6.83 (H) 10/27/2022   TSH 11.25 (H) 09/08/2022   TSH 9.390 (H) 07/21/2022   TSH 19.69 (H) 05/19/2022   TSH 35.55 (H) 03/03/2022   TSH  43.72 (H) 01/20/2022   FREET4 1.8 08/31/2023   FREET4 1.8 07/06/2023   FREET4 1.8 05/04/2023   FREET4 1.4 03/09/2023   FREET4 1.23 10/27/2022   FREET4 1.4 09/08/2022   FREET4 1.77 07/21/2022   FREET4 0.88 05/19/2022   FREET4 1.0 03/03/2022   FREET4 0.76 01/20/2022   T3FREE 2.0 (L) 10/14/2021   T3FREE 3.5 07/15/2021   T3FREE 7.0 (H) 05/20/2021   T3FREE 3.3 11/19/2020   T3FREE 3.3 07/16/2020   T3FREE 3.3 04/23/2020   T3FREE 3.6 02/27/2020   T3FREE 3.6 12/19/2019   T3FREE 3.4 10/17/2019   T3FREE 3.7 08/01/2019  11/10/2017: TSH 0.008, free T4 1.96 (0.82-1.77): Free T3 4.33 (2.0-4.4)  His TSI antibodies were not elevated: Lab Results  Component Value Date   TSI 123 04/19/2018   Pt denies: - feeling nodules in neck - hoarseness - dysphagia - choking  Pt does not have a FH of thyroid  ds. No FH of thyroid  cancer. No h/o radiation tx to head or neck. No Biotin use. No recent steroids use.   He has no other medical history.  ROS: + See HPI  I reviewed pt's medications, allergies, PMH, social hx, family hx, and changes were documented in the history of present illness. Otherwise, unchanged from my initial visit note.  PMH: - see HPI  Social History   Socioeconomic History   Marital status: Married    Spouse name: Not on file   Number of children: Not on file   Years of education: Not on file   Highest education level: Not on file  Occupational History   Not on file  Tobacco Use   Smoking status: Never   Smokeless tobacco: Never  Vaping Use   Vaping status: Never Used  Substance and Sexual Activity   Alcohol use: Never   Drug use: Never   Sexual activity: Not on file  Other Topics Concern   Not on file  Social History Narrative   Not on file   Social Drivers of Health   Tobacco Use: Low Risk (08/31/2023)   Patient History    Smoking Tobacco Use: Never    Smokeless Tobacco Use: Never    Passive Exposure: Not on file  Financial Resource Strain: Not on  file  Food Insecurity: Not on file  Transportation Needs: Not on file  Physical Activity: Not on file  Stress: Not on file  Social Connections: Not on file  Intimate Partner Violence: Not on file  Depression (EYV7-0): Not on file  Alcohol Screen: Not on file  Housing: Not on file  Utilities: Not on file  Health Literacy: Not on file   Current Outpatient Medications  Medication Sig Dispense Refill   levothyroxine  (SYNTHROID ) 200 MCG tablet Take 1 tablet (200 mcg total) by mouth daily.  90 tablet 1   levothyroxine  (SYNTHROID ) 25 MCG tablet Take 1 tablet (25 mcg total) by mouth daily. 90 tablet 1   No current facility-administered medications for this visit.   No Known Allergies   No family history on file.  PE: BP 124/80   Pulse 76   Ht 5' 7 (1.702 m)   Wt 223 lb 9.6 oz (101.4 kg)   SpO2 98%   BMI 35.02 kg/m  Wt Readings from Last 3 Encounters:  03/03/24 223 lb 9.6 oz (101.4 kg)  08/31/23 238 lb (108 kg)  03/09/23 234 lb 9.6 oz (106.4 kg)   Constitutional: overweight, in NAD Eyes: no exophthalmos ENT: no masses palpated in neck, no cervical lymphadenopathy Cardiovascular: RRR, No MRG Respiratory: CTA B Musculoskeletal: no deformities Skin:  no rashes Neurological: no tremor with outstretched hands  ASSESSMENT: 1. H/o Graves ds. - s/p RAI tx  2. Postablative hypothyroidism  PLAN:  1. And 2. Patient with history of Graves' disease, with initial thyrotoxic symptoms: Weight loss, heat intolerance, both resolved.  His TSI antibodies were not elevated.  He was initially started on a high methimazole  dose, which she subsequently stopped mentioning that he felt better off the medication.  However, we had to restart methimazole  afterwards with still uncontrolled thyroid  tests (a total of 3 recurrences) so we decided to proceed with radioactive iodine treatment.  We initially checked a thyroid  uptake and scan (07/2021), showing increased uptake at both 4 and 24 hours.  The  scan was uniform and he had a visible thyroid  lobe, confirming Graves' disease.  He had RAI treatment in 08/2021 and developed post ablative hypothyroidism.  We continued to adjust his levothyroxine  dose.  He does appear to require higher doses than usual.  At today's visit, through the Spanish interpreter, we discussed about possible causes for this. -At last visit, his TSH was finally within target range: Lab Results  Component Value Date   TSH 3.78 08/31/2023  - He continues on levothyroxine  225 mcg daily.  We discussed about possibly switching to brand-name Synthroid  but since test is normalized, we ended up staying on the generic. - pt feels good on this dose.  He previously had heat intolerance and some muscle aches, but now resolved after stopping an energy drink.  At last visit, he had mild fatigue but now feeling better and lost 15 lbs - adjusting diet.  We discussed that he may start requiring less levothyroxine  with him improving diet and losing weight. - we discussed about taking the thyroid  hormone every day, with water, >30 minutes before breakfast, separated by >4 hours from acid reflux medications, calcium, iron, multivitamins. Pt. is taking it correctly. - will check thyroid  tests today: TSH and fT4 - If labs are abnormal, he will need to return for repeat TFTs in 1.5 months - RTC in 6 mo  He needs refills.  Orders Placed This Encounter  Procedures   TSH   T4, free   Lela Fendt, MD PhD [facility]

## 2024-03-04 ENCOUNTER — Ambulatory Visit: Payer: Self-pay | Admitting: Internal Medicine

## 2024-03-04 LAB — TSH: TSH: 2.43 m[IU]/L (ref 0.40–4.50)

## 2024-03-04 LAB — T4, FREE: Free T4: 1.8 ng/dL (ref 0.8–1.8)

## 2024-03-04 MED ORDER — LEVOTHYROXINE SODIUM 25 MCG PO TABS: 25.0000 ug | ORAL_TABLET | Freq: Every day | ORAL | 1 refills | Status: AC

## 2024-03-04 MED ORDER — LEVOTHYROXINE SODIUM 200 MCG PO TABS: 200.0000 ug | ORAL_TABLET | Freq: Every day | ORAL | 1 refills | Status: AC

## 2024-03-04 NOTE — Addendum Note (Signed)
 Addended by: TRIXIE FILE on: 03/04/2024 03:06 PM   Modules accepted: Orders

## 2024-09-05 ENCOUNTER — Ambulatory Visit: Admitting: Internal Medicine
